# Patient Record
Sex: Male | Born: 1935
Health system: Southern US, Community
[De-identification: ages and names within clinical notes are randomized; demographics above are authoritative.]

## PROBLEM LIST (undated history)

## (undated) DIAGNOSIS — E7800 Pure hypercholesterolemia, unspecified: Secondary | ICD-10-CM

## (undated) DIAGNOSIS — M79606 Pain in leg, unspecified: Secondary | ICD-10-CM

## (undated) DIAGNOSIS — H3560 Retinal hemorrhage, unspecified eye: Secondary | ICD-10-CM

## (undated) HISTORY — PX: KNEE SURGERY: SHX244

## (undated) HISTORY — DX: Pain in leg, unspecified: M79.606

## (undated) HISTORY — PX: HERNIA REPAIR: SHX51

## (undated) HISTORY — PX: EYE SURGERY: SHX253

---

## 2001-02-08 ENCOUNTER — Ambulatory Visit (HOSPITAL_COMMUNITY): Admission: RE | Admit: 2001-02-08 | Discharge: 2001-02-08 | Payer: Self-pay | Admitting: Gastroenterology

## 2001-07-14 ENCOUNTER — Emergency Department (HOSPITAL_COMMUNITY): Admission: EM | Admit: 2001-07-14 | Discharge: 2001-07-14 | Payer: Self-pay | Admitting: Emergency Medicine

## 2001-07-14 ENCOUNTER — Encounter: Payer: Self-pay | Admitting: Emergency Medicine

## 2003-02-07 ENCOUNTER — Encounter: Admission: RE | Admit: 2003-02-07 | Discharge: 2003-02-07 | Payer: Self-pay | Admitting: Internal Medicine

## 2003-02-07 ENCOUNTER — Encounter: Payer: Self-pay | Admitting: Internal Medicine

## 2003-06-12 ENCOUNTER — Ambulatory Visit (HOSPITAL_COMMUNITY): Admission: RE | Admit: 2003-06-12 | Discharge: 2003-06-12 | Payer: Self-pay | Admitting: Ophthalmology

## 2004-07-17 ENCOUNTER — Observation Stay (HOSPITAL_COMMUNITY): Admission: EM | Admit: 2004-07-17 | Discharge: 2004-07-18 | Payer: Self-pay | Admitting: Emergency Medicine

## 2009-09-22 DIAGNOSIS — H348192 Central retinal vein occlusion, unspecified eye, stable: Secondary | ICD-10-CM | POA: Insufficient documentation

## 2009-09-22 DIAGNOSIS — R413 Other amnesia: Secondary | ICD-10-CM | POA: Insufficient documentation

## 2009-09-22 DIAGNOSIS — M1990 Unspecified osteoarthritis, unspecified site: Secondary | ICD-10-CM | POA: Insufficient documentation

## 2009-09-22 DIAGNOSIS — J302 Other seasonal allergic rhinitis: Secondary | ICD-10-CM | POA: Insufficient documentation

## 2010-12-17 ENCOUNTER — Other Ambulatory Visit: Payer: Self-pay | Admitting: Dermatology

## 2011-01-02 ENCOUNTER — Emergency Department (HOSPITAL_BASED_OUTPATIENT_CLINIC_OR_DEPARTMENT_OTHER)
Admission: EM | Admit: 2011-01-02 | Discharge: 2011-01-03 | Disposition: A | Discharge status: Home / Self Care | Payer: Medicare Other | Attending: Emergency Medicine | Admitting: Emergency Medicine

## 2011-01-02 DIAGNOSIS — E785 Hyperlipidemia, unspecified: Secondary | ICD-10-CM | POA: Insufficient documentation

## 2011-01-02 DIAGNOSIS — S01309A Unspecified open wound of unspecified ear, initial encounter: Secondary | ICD-10-CM | POA: Insufficient documentation

## 2011-01-02 DIAGNOSIS — W208XXA Other cause of strike by thrown, projected or falling object, initial encounter: Secondary | ICD-10-CM | POA: Insufficient documentation

## 2011-01-03 ENCOUNTER — Emergency Department (INDEPENDENT_AMBULATORY_CARE_PROVIDER_SITE_OTHER): Payer: Medicare Other

## 2011-01-03 DIAGNOSIS — M79609 Pain in unspecified limb: Secondary | ICD-10-CM

## 2011-01-03 DIAGNOSIS — W208XXA Other cause of strike by thrown, projected or falling object, initial encounter: Secondary | ICD-10-CM

## 2011-01-03 DIAGNOSIS — I999 Unspecified disorder of circulatory system: Secondary | ICD-10-CM

## 2011-01-03 DIAGNOSIS — R609 Edema, unspecified: Secondary | ICD-10-CM

## 2011-01-03 DIAGNOSIS — S7010XA Contusion of unspecified thigh, initial encounter: Secondary | ICD-10-CM

## 2011-01-03 DIAGNOSIS — S8010XA Contusion of unspecified lower leg, initial encounter: Secondary | ICD-10-CM

## 2011-01-03 DIAGNOSIS — S9030XA Contusion of unspecified foot, initial encounter: Secondary | ICD-10-CM

## 2011-01-19 ENCOUNTER — Encounter (INDEPENDENT_AMBULATORY_CARE_PROVIDER_SITE_OTHER): Payer: Medicare Other

## 2011-01-19 DIAGNOSIS — R609 Edema, unspecified: Secondary | ICD-10-CM
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
## 2011-03-17 ENCOUNTER — Other Ambulatory Visit: Payer: Self-pay | Admitting: Gastroenterology

## 2011-12-13 DIAGNOSIS — H40019 Open angle with borderline findings, low risk, unspecified eye: Secondary | ICD-10-CM | POA: Diagnosis not present

## 2011-12-13 DIAGNOSIS — H35379 Puckering of macula, unspecified eye: Secondary | ICD-10-CM | POA: Diagnosis not present

## 2011-12-13 DIAGNOSIS — H04129 Dry eye syndrome of unspecified lacrimal gland: Secondary | ICD-10-CM | POA: Diagnosis not present

## 2011-12-13 DIAGNOSIS — H2510 Age-related nuclear cataract, unspecified eye: Secondary | ICD-10-CM | POA: Diagnosis not present

## 2011-12-13 DIAGNOSIS — D3130 Benign neoplasm of unspecified choroid: Secondary | ICD-10-CM | POA: Diagnosis not present

## 2011-12-15 DIAGNOSIS — E785 Hyperlipidemia, unspecified: Secondary | ICD-10-CM | POA: Diagnosis not present

## 2011-12-15 DIAGNOSIS — R609 Edema, unspecified: Secondary | ICD-10-CM | POA: Diagnosis not present

## 2011-12-15 DIAGNOSIS — K219 Gastro-esophageal reflux disease without esophagitis: Secondary | ICD-10-CM | POA: Diagnosis not present

## 2011-12-15 DIAGNOSIS — J301 Allergic rhinitis due to pollen: Secondary | ICD-10-CM | POA: Diagnosis not present

## 2012-01-03 ENCOUNTER — Other Ambulatory Visit: Payer: Self-pay | Admitting: Dermatology

## 2012-01-03 DIAGNOSIS — L570 Actinic keratosis: Secondary | ICD-10-CM | POA: Diagnosis not present

## 2012-01-03 DIAGNOSIS — Z85828 Personal history of other malignant neoplasm of skin: Secondary | ICD-10-CM | POA: Diagnosis not present

## 2012-01-03 DIAGNOSIS — D239 Other benign neoplasm of skin, unspecified: Secondary | ICD-10-CM | POA: Diagnosis not present

## 2012-01-26 DIAGNOSIS — H538 Other visual disturbances: Secondary | ICD-10-CM | POA: Diagnosis not present

## 2012-03-13 DIAGNOSIS — L821 Other seborrheic keratosis: Secondary | ICD-10-CM | POA: Diagnosis not present

## 2012-03-13 DIAGNOSIS — L570 Actinic keratosis: Secondary | ICD-10-CM | POA: Diagnosis not present

## 2012-04-17 DIAGNOSIS — Z23 Encounter for immunization: Secondary | ICD-10-CM | POA: Diagnosis not present

## 2012-06-13 DIAGNOSIS — G459 Transient cerebral ischemic attack, unspecified: Secondary | ICD-10-CM | POA: Diagnosis not present

## 2012-06-13 DIAGNOSIS — H3400 Transient retinal artery occlusion, unspecified eye: Secondary | ICD-10-CM | POA: Diagnosis not present

## 2012-06-13 DIAGNOSIS — H43399 Other vitreous opacities, unspecified eye: Secondary | ICD-10-CM | POA: Diagnosis not present

## 2012-06-13 DIAGNOSIS — H349 Unspecified retinal vascular occlusion: Secondary | ICD-10-CM | POA: Diagnosis not present

## 2012-06-13 DIAGNOSIS — H40019 Open angle with borderline findings, low risk, unspecified eye: Secondary | ICD-10-CM | POA: Diagnosis not present

## 2012-06-13 DIAGNOSIS — H2510 Age-related nuclear cataract, unspecified eye: Secondary | ICD-10-CM | POA: Diagnosis not present

## 2012-06-13 DIAGNOSIS — E785 Hyperlipidemia, unspecified: Secondary | ICD-10-CM | POA: Diagnosis not present

## 2012-06-14 DIAGNOSIS — I658 Occlusion and stenosis of other precerebral arteries: Secondary | ICD-10-CM | POA: Diagnosis not present

## 2012-06-14 DIAGNOSIS — G459 Transient cerebral ischemic attack, unspecified: Secondary | ICD-10-CM | POA: Diagnosis not present

## 2012-06-16 DIAGNOSIS — G459 Transient cerebral ischemic attack, unspecified: Secondary | ICD-10-CM | POA: Diagnosis not present

## 2012-06-16 DIAGNOSIS — Z1331 Encounter for screening for depression: Secondary | ICD-10-CM | POA: Diagnosis not present

## 2012-06-16 DIAGNOSIS — H349 Unspecified retinal vascular occlusion: Secondary | ICD-10-CM | POA: Diagnosis not present

## 2012-07-03 ENCOUNTER — Other Ambulatory Visit: Payer: Self-pay | Admitting: Dermatology

## 2012-07-03 DIAGNOSIS — L821 Other seborrheic keratosis: Secondary | ICD-10-CM | POA: Diagnosis not present

## 2012-07-03 DIAGNOSIS — D485 Neoplasm of uncertain behavior of skin: Secondary | ICD-10-CM | POA: Diagnosis not present

## 2012-07-03 DIAGNOSIS — L570 Actinic keratosis: Secondary | ICD-10-CM | POA: Diagnosis not present

## 2012-07-03 DIAGNOSIS — Z85828 Personal history of other malignant neoplasm of skin: Secondary | ICD-10-CM | POA: Diagnosis not present

## 2012-07-03 DIAGNOSIS — L905 Scar conditions and fibrosis of skin: Secondary | ICD-10-CM | POA: Diagnosis not present

## 2012-07-03 DIAGNOSIS — L819 Disorder of pigmentation, unspecified: Secondary | ICD-10-CM | POA: Diagnosis not present

## 2012-07-03 DIAGNOSIS — L851 Acquired keratosis [keratoderma] palmaris et plantaris: Secondary | ICD-10-CM | POA: Diagnosis not present

## 2012-09-26 DIAGNOSIS — E785 Hyperlipidemia, unspecified: Secondary | ICD-10-CM | POA: Diagnosis not present

## 2012-09-26 DIAGNOSIS — Z79899 Other long term (current) drug therapy: Secondary | ICD-10-CM | POA: Diagnosis not present

## 2012-09-26 DIAGNOSIS — Z125 Encounter for screening for malignant neoplasm of prostate: Secondary | ICD-10-CM | POA: Diagnosis not present

## 2012-10-03 DIAGNOSIS — K219 Gastro-esophageal reflux disease without esophagitis: Secondary | ICD-10-CM | POA: Diagnosis not present

## 2012-10-03 DIAGNOSIS — Z125 Encounter for screening for malignant neoplasm of prostate: Secondary | ICD-10-CM | POA: Diagnosis not present

## 2012-10-03 DIAGNOSIS — Z0000 Encounter for general adult medical examination without abnormal findings: Secondary | ICD-10-CM | POA: Diagnosis not present

## 2012-10-03 DIAGNOSIS — G459 Transient cerebral ischemic attack, unspecified: Secondary | ICD-10-CM | POA: Diagnosis not present

## 2012-10-04 DIAGNOSIS — Z1212 Encounter for screening for malignant neoplasm of rectum: Secondary | ICD-10-CM | POA: Diagnosis not present

## 2012-10-05 ENCOUNTER — Other Ambulatory Visit: Payer: Self-pay | Admitting: Dermatology

## 2012-10-05 DIAGNOSIS — L259 Unspecified contact dermatitis, unspecified cause: Secondary | ICD-10-CM | POA: Diagnosis not present

## 2012-10-05 DIAGNOSIS — L719 Rosacea, unspecified: Secondary | ICD-10-CM | POA: Diagnosis not present

## 2012-10-05 DIAGNOSIS — L723 Sebaceous cyst: Secondary | ICD-10-CM | POA: Diagnosis not present

## 2012-10-05 DIAGNOSIS — L570 Actinic keratosis: Secondary | ICD-10-CM | POA: Diagnosis not present

## 2012-10-05 DIAGNOSIS — Z85828 Personal history of other malignant neoplasm of skin: Secondary | ICD-10-CM | POA: Diagnosis not present

## 2012-10-05 DIAGNOSIS — D485 Neoplasm of uncertain behavior of skin: Secondary | ICD-10-CM | POA: Diagnosis not present

## 2013-01-08 DIAGNOSIS — L570 Actinic keratosis: Secondary | ICD-10-CM | POA: Diagnosis not present

## 2013-01-08 DIAGNOSIS — Z85828 Personal history of other malignant neoplasm of skin: Secondary | ICD-10-CM | POA: Diagnosis not present

## 2013-01-08 DIAGNOSIS — L259 Unspecified contact dermatitis, unspecified cause: Secondary | ICD-10-CM | POA: Diagnosis not present

## 2013-01-08 DIAGNOSIS — L821 Other seborrheic keratosis: Secondary | ICD-10-CM | POA: Diagnosis not present

## 2013-03-05 DIAGNOSIS — H40019 Open angle with borderline findings, low risk, unspecified eye: Secondary | ICD-10-CM | POA: Diagnosis not present

## 2013-03-05 DIAGNOSIS — D3130 Benign neoplasm of unspecified choroid: Secondary | ICD-10-CM | POA: Diagnosis not present

## 2013-03-05 DIAGNOSIS — H35379 Puckering of macula, unspecified eye: Secondary | ICD-10-CM | POA: Diagnosis not present

## 2013-03-05 DIAGNOSIS — H35039 Hypertensive retinopathy, unspecified eye: Secondary | ICD-10-CM | POA: Diagnosis not present

## 2013-03-05 DIAGNOSIS — H04129 Dry eye syndrome of unspecified lacrimal gland: Secondary | ICD-10-CM | POA: Diagnosis not present

## 2013-03-05 DIAGNOSIS — H43399 Other vitreous opacities, unspecified eye: Secondary | ICD-10-CM | POA: Diagnosis not present

## 2013-03-05 DIAGNOSIS — H3400 Transient retinal artery occlusion, unspecified eye: Secondary | ICD-10-CM | POA: Diagnosis not present

## 2013-03-05 DIAGNOSIS — H2510 Age-related nuclear cataract, unspecified eye: Secondary | ICD-10-CM | POA: Diagnosis not present

## 2013-03-06 DIAGNOSIS — E785 Hyperlipidemia, unspecified: Secondary | ICD-10-CM | POA: Diagnosis not present

## 2013-03-06 DIAGNOSIS — H349 Unspecified retinal vascular occlusion: Secondary | ICD-10-CM | POA: Diagnosis not present

## 2013-03-06 DIAGNOSIS — I495 Sick sinus syndrome: Secondary | ICD-10-CM | POA: Diagnosis not present

## 2013-03-06 DIAGNOSIS — G459 Transient cerebral ischemic attack, unspecified: Secondary | ICD-10-CM | POA: Diagnosis not present

## 2013-03-12 DIAGNOSIS — E782 Mixed hyperlipidemia: Secondary | ICD-10-CM | POA: Diagnosis not present

## 2013-03-12 DIAGNOSIS — G458 Other transient cerebral ischemic attacks and related syndromes: Secondary | ICD-10-CM | POA: Diagnosis not present

## 2013-03-12 DIAGNOSIS — I498 Other specified cardiac arrhythmias: Secondary | ICD-10-CM | POA: Diagnosis not present

## 2013-03-22 DIAGNOSIS — I498 Other specified cardiac arrhythmias: Secondary | ICD-10-CM | POA: Diagnosis not present

## 2013-03-22 DIAGNOSIS — G459 Transient cerebral ischemic attack, unspecified: Secondary | ICD-10-CM | POA: Diagnosis not present

## 2013-03-29 DIAGNOSIS — I429 Cardiomyopathy, unspecified: Secondary | ICD-10-CM | POA: Diagnosis not present

## 2013-04-03 DIAGNOSIS — I498 Other specified cardiac arrhythmias: Secondary | ICD-10-CM | POA: Diagnosis not present

## 2013-04-03 DIAGNOSIS — G459 Transient cerebral ischemic attack, unspecified: Secondary | ICD-10-CM | POA: Diagnosis not present

## 2013-04-05 DIAGNOSIS — E785 Hyperlipidemia, unspecified: Secondary | ICD-10-CM | POA: Diagnosis not present

## 2013-04-05 DIAGNOSIS — Z23 Encounter for immunization: Secondary | ICD-10-CM | POA: Diagnosis not present

## 2013-04-05 DIAGNOSIS — I495 Sick sinus syndrome: Secondary | ICD-10-CM | POA: Diagnosis not present

## 2013-04-05 DIAGNOSIS — Z6826 Body mass index (BMI) 26.0-26.9, adult: Secondary | ICD-10-CM | POA: Diagnosis not present

## 2013-04-05 DIAGNOSIS — G459 Transient cerebral ischemic attack, unspecified: Secondary | ICD-10-CM | POA: Diagnosis not present

## 2013-04-11 DIAGNOSIS — E782 Mixed hyperlipidemia: Secondary | ICD-10-CM | POA: Diagnosis not present

## 2013-04-11 DIAGNOSIS — G458 Other transient cerebral ischemic attacks and related syndromes: Secondary | ICD-10-CM | POA: Diagnosis not present

## 2013-04-11 DIAGNOSIS — I498 Other specified cardiac arrhythmias: Secondary | ICD-10-CM | POA: Diagnosis not present

## 2013-04-11 DIAGNOSIS — Z8669 Personal history of other diseases of the nervous system and sense organs: Secondary | ICD-10-CM | POA: Diagnosis not present

## 2013-05-07 DIAGNOSIS — E785 Hyperlipidemia, unspecified: Secondary | ICD-10-CM | POA: Diagnosis not present

## 2013-06-06 DIAGNOSIS — H04129 Dry eye syndrome of unspecified lacrimal gland: Secondary | ICD-10-CM | POA: Diagnosis not present

## 2013-06-06 DIAGNOSIS — H40019 Open angle with borderline findings, low risk, unspecified eye: Secondary | ICD-10-CM | POA: Diagnosis not present

## 2013-06-06 DIAGNOSIS — H10509 Unspecified blepharoconjunctivitis, unspecified eye: Secondary | ICD-10-CM | POA: Diagnosis not present

## 2013-07-09 DIAGNOSIS — L570 Actinic keratosis: Secondary | ICD-10-CM | POA: Diagnosis not present

## 2013-07-09 DIAGNOSIS — L821 Other seborrheic keratosis: Secondary | ICD-10-CM | POA: Diagnosis not present

## 2013-07-09 DIAGNOSIS — L408 Other psoriasis: Secondary | ICD-10-CM | POA: Diagnosis not present

## 2013-07-09 DIAGNOSIS — Z85828 Personal history of other malignant neoplasm of skin: Secondary | ICD-10-CM | POA: Diagnosis not present

## 2013-10-01 DIAGNOSIS — E785 Hyperlipidemia, unspecified: Secondary | ICD-10-CM | POA: Diagnosis not present

## 2013-10-01 DIAGNOSIS — Z125 Encounter for screening for malignant neoplasm of prostate: Secondary | ICD-10-CM | POA: Diagnosis not present

## 2013-10-04 DIAGNOSIS — Z0000 Encounter for general adult medical examination without abnormal findings: Secondary | ICD-10-CM | POA: Diagnosis not present

## 2013-10-04 DIAGNOSIS — G459 Transient cerebral ischemic attack, unspecified: Secondary | ICD-10-CM | POA: Diagnosis not present

## 2013-10-04 DIAGNOSIS — K219 Gastro-esophageal reflux disease without esophagitis: Secondary | ICD-10-CM | POA: Diagnosis not present

## 2013-10-04 DIAGNOSIS — H349 Unspecified retinal vascular occlusion: Secondary | ICD-10-CM | POA: Diagnosis not present

## 2013-10-04 DIAGNOSIS — Z125 Encounter for screening for malignant neoplasm of prostate: Secondary | ICD-10-CM | POA: Diagnosis not present

## 2013-10-04 DIAGNOSIS — E785 Hyperlipidemia, unspecified: Secondary | ICD-10-CM | POA: Diagnosis not present

## 2013-10-04 DIAGNOSIS — M1990 Unspecified osteoarthritis, unspecified site: Secondary | ICD-10-CM | POA: Diagnosis not present

## 2013-10-04 DIAGNOSIS — I495 Sick sinus syndrome: Secondary | ICD-10-CM | POA: Diagnosis not present

## 2013-10-04 DIAGNOSIS — Z1331 Encounter for screening for depression: Secondary | ICD-10-CM | POA: Diagnosis not present

## 2013-10-04 DIAGNOSIS — J301 Allergic rhinitis due to pollen: Secondary | ICD-10-CM | POA: Diagnosis not present

## 2013-10-04 DIAGNOSIS — Z23 Encounter for immunization: Secondary | ICD-10-CM | POA: Diagnosis not present

## 2013-10-09 DIAGNOSIS — Z1212 Encounter for screening for malignant neoplasm of rectum: Secondary | ICD-10-CM | POA: Diagnosis not present

## 2013-10-29 DIAGNOSIS — G458 Other transient cerebral ischemic attacks and related syndromes: Secondary | ICD-10-CM | POA: Diagnosis not present

## 2013-10-29 DIAGNOSIS — I498 Other specified cardiac arrhythmias: Secondary | ICD-10-CM | POA: Diagnosis not present

## 2013-10-29 DIAGNOSIS — Z8669 Personal history of other diseases of the nervous system and sense organs: Secondary | ICD-10-CM | POA: Diagnosis not present

## 2013-12-04 DIAGNOSIS — H2510 Age-related nuclear cataract, unspecified eye: Secondary | ICD-10-CM | POA: Diagnosis not present

## 2013-12-04 DIAGNOSIS — H524 Presbyopia: Secondary | ICD-10-CM | POA: Diagnosis not present

## 2013-12-04 DIAGNOSIS — H40019 Open angle with borderline findings, low risk, unspecified eye: Secondary | ICD-10-CM | POA: Diagnosis not present

## 2013-12-04 DIAGNOSIS — H35039 Hypertensive retinopathy, unspecified eye: Secondary | ICD-10-CM | POA: Diagnosis not present

## 2014-01-07 DIAGNOSIS — L570 Actinic keratosis: Secondary | ICD-10-CM | POA: Diagnosis not present

## 2014-01-07 DIAGNOSIS — L821 Other seborrheic keratosis: Secondary | ICD-10-CM | POA: Diagnosis not present

## 2014-01-07 DIAGNOSIS — D1801 Hemangioma of skin and subcutaneous tissue: Secondary | ICD-10-CM | POA: Diagnosis not present

## 2014-01-07 DIAGNOSIS — D692 Other nonthrombocytopenic purpura: Secondary | ICD-10-CM | POA: Diagnosis not present

## 2014-01-07 DIAGNOSIS — Z85828 Personal history of other malignant neoplasm of skin: Secondary | ICD-10-CM | POA: Diagnosis not present

## 2014-04-15 DIAGNOSIS — Z6829 Body mass index (BMI) 29.0-29.9, adult: Secondary | ICD-10-CM | POA: Diagnosis not present

## 2014-04-15 DIAGNOSIS — Z1331 Encounter for screening for depression: Secondary | ICD-10-CM | POA: Diagnosis not present

## 2014-04-15 DIAGNOSIS — G459 Transient cerebral ischemic attack, unspecified: Secondary | ICD-10-CM | POA: Diagnosis not present

## 2014-04-15 DIAGNOSIS — I495 Sick sinus syndrome: Secondary | ICD-10-CM | POA: Diagnosis not present

## 2014-04-15 DIAGNOSIS — E785 Hyperlipidemia, unspecified: Secondary | ICD-10-CM | POA: Diagnosis not present

## 2014-04-15 DIAGNOSIS — K219 Gastro-esophageal reflux disease without esophagitis: Secondary | ICD-10-CM | POA: Diagnosis not present

## 2014-04-15 DIAGNOSIS — H349 Unspecified retinal vascular occlusion: Secondary | ICD-10-CM | POA: Diagnosis not present

## 2014-04-18 DIAGNOSIS — E785 Hyperlipidemia, unspecified: Secondary | ICD-10-CM | POA: Diagnosis not present

## 2014-05-06 DIAGNOSIS — R001 Bradycardia, unspecified: Secondary | ICD-10-CM | POA: Diagnosis not present

## 2014-05-06 DIAGNOSIS — E782 Mixed hyperlipidemia: Secondary | ICD-10-CM | POA: Diagnosis not present

## 2014-05-21 DIAGNOSIS — Z23 Encounter for immunization: Secondary | ICD-10-CM | POA: Diagnosis not present

## 2014-06-10 DIAGNOSIS — H40013 Open angle with borderline findings, low risk, bilateral: Secondary | ICD-10-CM | POA: Diagnosis not present

## 2014-07-08 ENCOUNTER — Other Ambulatory Visit: Payer: Self-pay | Admitting: Dermatology

## 2014-07-08 DIAGNOSIS — D485 Neoplasm of uncertain behavior of skin: Secondary | ICD-10-CM | POA: Diagnosis not present

## 2014-07-08 DIAGNOSIS — L400 Psoriasis vulgaris: Secondary | ICD-10-CM | POA: Diagnosis not present

## 2014-07-08 DIAGNOSIS — L821 Other seborrheic keratosis: Secondary | ICD-10-CM | POA: Diagnosis not present

## 2014-07-08 DIAGNOSIS — L720 Epidermal cyst: Secondary | ICD-10-CM | POA: Diagnosis not present

## 2014-07-08 DIAGNOSIS — L570 Actinic keratosis: Secondary | ICD-10-CM | POA: Diagnosis not present

## 2014-07-08 DIAGNOSIS — Z85828 Personal history of other malignant neoplasm of skin: Secondary | ICD-10-CM | POA: Diagnosis not present

## 2014-07-08 DIAGNOSIS — L853 Xerosis cutis: Secondary | ICD-10-CM | POA: Diagnosis not present

## 2014-10-23 DIAGNOSIS — E785 Hyperlipidemia, unspecified: Secondary | ICD-10-CM | POA: Diagnosis not present

## 2014-10-23 DIAGNOSIS — Z008 Encounter for other general examination: Secondary | ICD-10-CM | POA: Diagnosis not present

## 2014-10-23 DIAGNOSIS — Z125 Encounter for screening for malignant neoplasm of prostate: Secondary | ICD-10-CM | POA: Diagnosis not present

## 2014-10-30 DIAGNOSIS — Z1389 Encounter for screening for other disorder: Secondary | ICD-10-CM | POA: Diagnosis not present

## 2014-10-30 DIAGNOSIS — R001 Bradycardia, unspecified: Secondary | ICD-10-CM | POA: Diagnosis not present

## 2014-10-30 DIAGNOSIS — E785 Hyperlipidemia, unspecified: Secondary | ICD-10-CM | POA: Diagnosis not present

## 2014-10-30 DIAGNOSIS — Z6827 Body mass index (BMI) 27.0-27.9, adult: Secondary | ICD-10-CM | POA: Diagnosis not present

## 2014-10-30 DIAGNOSIS — N400 Enlarged prostate without lower urinary tract symptoms: Secondary | ICD-10-CM | POA: Insufficient documentation

## 2014-10-30 DIAGNOSIS — R413 Other amnesia: Secondary | ICD-10-CM | POA: Diagnosis not present

## 2014-10-30 DIAGNOSIS — M1990 Unspecified osteoarthritis, unspecified site: Secondary | ICD-10-CM | POA: Diagnosis not present

## 2014-10-30 DIAGNOSIS — R600 Localized edema: Secondary | ICD-10-CM | POA: Diagnosis not present

## 2014-10-30 DIAGNOSIS — H34819 Central retinal vein occlusion, unspecified eye: Secondary | ICD-10-CM | POA: Diagnosis not present

## 2014-10-30 DIAGNOSIS — K219 Gastro-esophageal reflux disease without esophagitis: Secondary | ICD-10-CM | POA: Diagnosis not present

## 2014-10-30 DIAGNOSIS — G459 Transient cerebral ischemic attack, unspecified: Secondary | ICD-10-CM | POA: Diagnosis not present

## 2014-10-30 DIAGNOSIS — Z0000 Encounter for general adult medical examination without abnormal findings: Secondary | ICD-10-CM | POA: Diagnosis not present

## 2014-10-30 DIAGNOSIS — N183 Chronic kidney disease, stage 3 (moderate): Secondary | ICD-10-CM | POA: Diagnosis not present

## 2014-10-31 DIAGNOSIS — Z1212 Encounter for screening for malignant neoplasm of rectum: Secondary | ICD-10-CM | POA: Diagnosis not present

## 2014-12-09 DIAGNOSIS — H40013 Open angle with borderline findings, low risk, bilateral: Secondary | ICD-10-CM | POA: Diagnosis not present

## 2014-12-09 DIAGNOSIS — H31001 Unspecified chorioretinal scars, right eye: Secondary | ICD-10-CM | POA: Diagnosis not present

## 2014-12-09 DIAGNOSIS — H04123 Dry eye syndrome of bilateral lacrimal glands: Secondary | ICD-10-CM | POA: Diagnosis not present

## 2014-12-09 DIAGNOSIS — H3582 Retinal ischemia: Secondary | ICD-10-CM | POA: Diagnosis not present

## 2015-01-06 DIAGNOSIS — L400 Psoriasis vulgaris: Secondary | ICD-10-CM | POA: Diagnosis not present

## 2015-01-06 DIAGNOSIS — L570 Actinic keratosis: Secondary | ICD-10-CM | POA: Diagnosis not present

## 2015-01-06 DIAGNOSIS — Z85828 Personal history of other malignant neoplasm of skin: Secondary | ICD-10-CM | POA: Diagnosis not present

## 2015-01-06 DIAGNOSIS — D692 Other nonthrombocytopenic purpura: Secondary | ICD-10-CM | POA: Diagnosis not present

## 2015-01-06 DIAGNOSIS — L820 Inflamed seborrheic keratosis: Secondary | ICD-10-CM | POA: Diagnosis not present

## 2015-01-06 DIAGNOSIS — L821 Other seborrheic keratosis: Secondary | ICD-10-CM | POA: Diagnosis not present

## 2015-02-12 ENCOUNTER — Encounter (HOSPITAL_BASED_OUTPATIENT_CLINIC_OR_DEPARTMENT_OTHER): Payer: Self-pay | Admitting: *Deleted

## 2015-02-12 ENCOUNTER — Emergency Department (HOSPITAL_BASED_OUTPATIENT_CLINIC_OR_DEPARTMENT_OTHER): Payer: Medicare Other

## 2015-02-12 ENCOUNTER — Emergency Department (HOSPITAL_BASED_OUTPATIENT_CLINIC_OR_DEPARTMENT_OTHER)
Admission: EM | Admit: 2015-02-12 | Discharge: 2015-02-12 | Disposition: A | Discharge status: Home / Self Care | Payer: Medicare Other | Attending: Emergency Medicine | Admitting: Emergency Medicine

## 2015-02-12 DIAGNOSIS — Y92410 Unspecified street and highway as the place of occurrence of the external cause: Secondary | ICD-10-CM | POA: Diagnosis not present

## 2015-02-12 DIAGNOSIS — Z7982 Long term (current) use of aspirin: Secondary | ICD-10-CM | POA: Diagnosis not present

## 2015-02-12 DIAGNOSIS — Y9389 Activity, other specified: Secondary | ICD-10-CM | POA: Diagnosis not present

## 2015-02-12 DIAGNOSIS — Z79899 Other long term (current) drug therapy: Secondary | ICD-10-CM | POA: Insufficient documentation

## 2015-02-12 DIAGNOSIS — Z8669 Personal history of other diseases of the nervous system and sense organs: Secondary | ICD-10-CM | POA: Insufficient documentation

## 2015-02-12 DIAGNOSIS — M25551 Pain in right hip: Secondary | ICD-10-CM | POA: Diagnosis not present

## 2015-02-12 DIAGNOSIS — Y998 Other external cause status: Secondary | ICD-10-CM | POA: Diagnosis not present

## 2015-02-12 DIAGNOSIS — S79911A Unspecified injury of right hip, initial encounter: Secondary | ICD-10-CM | POA: Insufficient documentation

## 2015-02-12 DIAGNOSIS — S20221A Contusion of right back wall of thorax, initial encounter: Secondary | ICD-10-CM | POA: Diagnosis not present

## 2015-02-12 DIAGNOSIS — Z7901 Long term (current) use of anticoagulants: Secondary | ICD-10-CM | POA: Insufficient documentation

## 2015-02-12 DIAGNOSIS — S299XXA Unspecified injury of thorax, initial encounter: Secondary | ICD-10-CM | POA: Diagnosis not present

## 2015-02-12 DIAGNOSIS — M1611 Unilateral primary osteoarthritis, right hip: Secondary | ICD-10-CM | POA: Diagnosis not present

## 2015-02-12 HISTORY — DX: Retinal hemorrhage, unspecified eye: H35.60

## 2015-02-12 MED ORDER — HYDROCODONE-ACETAMINOPHEN 5-325 MG PO TABS: 1.0000 | ORAL_TABLET | Freq: Four times a day (QID) | ORAL | Status: DC | PRN

## 2015-02-12 MED ORDER — HYDROCODONE-ACETAMINOPHEN 5-325 MG PO TABS
2.0000 | ORAL_TABLET | Freq: Once | ORAL | Status: AC
  Administered 2015-02-12: 2 via ORAL
  Filled 2015-02-12: qty 2

## 2015-02-12 NOTE — Discharge Instructions (Signed)
Take motrin for pain.  Take vicodin for severe pain. Do NOT drive with it.   See orthopedic doctor for follow up.   Return to ER if you have severe back or hip pain, unable to walk.

## 2015-02-12 NOTE — ED Notes (Signed)
MD at bedside. 
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
## 2015-02-12 NOTE — ED Notes (Signed)
Pt. returned from XR. 

## 2015-02-12 NOTE — ED Notes (Signed)
Pt transported to XR by stretcher.
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
## 2015-02-14 DIAGNOSIS — Z6827 Body mass index (BMI) 27.0-27.9, adult: Secondary | ICD-10-CM | POA: Diagnosis not present

## 2015-02-14 DIAGNOSIS — G589 Mononeuropathy, unspecified: Secondary | ICD-10-CM | POA: Diagnosis not present

## 2015-02-14 DIAGNOSIS — M1611 Unilateral primary osteoarthritis, right hip: Secondary | ICD-10-CM | POA: Diagnosis not present

## 2015-02-14 DIAGNOSIS — M62830 Muscle spasm of back: Secondary | ICD-10-CM | POA: Diagnosis not present

## 2015-02-19 DIAGNOSIS — M545 Low back pain: Secondary | ICD-10-CM | POA: Diagnosis not present

## 2015-04-24 DIAGNOSIS — Z23 Encounter for immunization: Secondary | ICD-10-CM | POA: Diagnosis not present

## 2015-05-05 DIAGNOSIS — E785 Hyperlipidemia, unspecified: Secondary | ICD-10-CM | POA: Diagnosis not present

## 2015-05-05 DIAGNOSIS — G459 Transient cerebral ischemic attack, unspecified: Secondary | ICD-10-CM | POA: Diagnosis not present

## 2015-05-05 DIAGNOSIS — Z6827 Body mass index (BMI) 27.0-27.9, adult: Secondary | ICD-10-CM | POA: Diagnosis not present

## 2015-05-05 DIAGNOSIS — N183 Chronic kidney disease, stage 3 (moderate): Secondary | ICD-10-CM | POA: Diagnosis not present

## 2015-05-05 DIAGNOSIS — K219 Gastro-esophageal reflux disease without esophagitis: Secondary | ICD-10-CM | POA: Diagnosis not present

## 2015-05-05 DIAGNOSIS — M1990 Unspecified osteoarthritis, unspecified site: Secondary | ICD-10-CM | POA: Diagnosis not present

## 2015-05-12 DIAGNOSIS — G451 Carotid artery syndrome (hemispheric): Secondary | ICD-10-CM | POA: Diagnosis not present

## 2015-05-12 DIAGNOSIS — E782 Mixed hyperlipidemia: Secondary | ICD-10-CM | POA: Diagnosis not present

## 2015-05-12 DIAGNOSIS — R001 Bradycardia, unspecified: Secondary | ICD-10-CM | POA: Diagnosis not present

## 2015-05-30 DIAGNOSIS — H02813 Retained foreign body in right eye, unspecified eyelid: Secondary | ICD-10-CM | POA: Diagnosis not present

## 2015-06-16 DIAGNOSIS — H40013 Open angle with borderline findings, low risk, bilateral: Secondary | ICD-10-CM | POA: Diagnosis not present

## 2015-06-16 DIAGNOSIS — H524 Presbyopia: Secondary | ICD-10-CM | POA: Diagnosis not present

## 2015-06-16 DIAGNOSIS — H01003 Unspecified blepharitis right eye, unspecified eyelid: Secondary | ICD-10-CM | POA: Diagnosis not present

## 2015-06-16 DIAGNOSIS — H04123 Dry eye syndrome of bilateral lacrimal glands: Secondary | ICD-10-CM | POA: Diagnosis not present

## 2015-07-14 DIAGNOSIS — L300 Nummular dermatitis: Secondary | ICD-10-CM | POA: Diagnosis not present

## 2015-07-14 DIAGNOSIS — D1801 Hemangioma of skin and subcutaneous tissue: Secondary | ICD-10-CM | POA: Diagnosis not present

## 2015-07-14 DIAGNOSIS — L570 Actinic keratosis: Secondary | ICD-10-CM | POA: Diagnosis not present

## 2015-07-14 DIAGNOSIS — Z85828 Personal history of other malignant neoplasm of skin: Secondary | ICD-10-CM | POA: Diagnosis not present

## 2015-07-14 DIAGNOSIS — L821 Other seborrheic keratosis: Secondary | ICD-10-CM | POA: Diagnosis not present

## 2015-09-01 DIAGNOSIS — J069 Acute upper respiratory infection, unspecified: Secondary | ICD-10-CM | POA: Diagnosis not present

## 2015-09-01 DIAGNOSIS — Z6827 Body mass index (BMI) 27.0-27.9, adult: Secondary | ICD-10-CM | POA: Diagnosis not present

## 2015-09-01 DIAGNOSIS — R05 Cough: Secondary | ICD-10-CM | POA: Diagnosis not present

## 2015-10-11 ENCOUNTER — Emergency Department (HOSPITAL_BASED_OUTPATIENT_CLINIC_OR_DEPARTMENT_OTHER): Payer: Medicare Other

## 2015-10-11 ENCOUNTER — Encounter (HOSPITAL_BASED_OUTPATIENT_CLINIC_OR_DEPARTMENT_OTHER): Payer: Self-pay | Admitting: *Deleted

## 2015-10-11 ENCOUNTER — Emergency Department (HOSPITAL_BASED_OUTPATIENT_CLINIC_OR_DEPARTMENT_OTHER)
Admission: EM | Admit: 2015-10-11 | Discharge: 2015-10-11 | Disposition: A | Discharge status: Home / Self Care | Payer: Medicare Other | Attending: Emergency Medicine | Admitting: Emergency Medicine

## 2015-10-11 DIAGNOSIS — J209 Acute bronchitis, unspecified: Secondary | ICD-10-CM | POA: Diagnosis not present

## 2015-10-11 DIAGNOSIS — J219 Acute bronchiolitis, unspecified: Secondary | ICD-10-CM | POA: Diagnosis not present

## 2015-10-11 DIAGNOSIS — Z7982 Long term (current) use of aspirin: Secondary | ICD-10-CM | POA: Insufficient documentation

## 2015-10-11 DIAGNOSIS — Z79899 Other long term (current) drug therapy: Secondary | ICD-10-CM | POA: Insufficient documentation

## 2015-10-11 DIAGNOSIS — Z8669 Personal history of other diseases of the nervous system and sense organs: Secondary | ICD-10-CM | POA: Diagnosis not present

## 2015-10-11 DIAGNOSIS — Z7902 Long term (current) use of antithrombotics/antiplatelets: Secondary | ICD-10-CM | POA: Insufficient documentation

## 2015-10-11 DIAGNOSIS — R05 Cough: Secondary | ICD-10-CM | POA: Diagnosis present

## 2015-10-11 MED ORDER — ALBUTEROL SULFATE HFA 108 (90 BASE) MCG/ACT IN AERS: 2.0000 | INHALATION_SPRAY | RESPIRATORY_TRACT | Status: DC | PRN

## 2015-10-11 MED ORDER — AEROCHAMBER PLUS W/MASK MISC: Status: DC

## 2015-10-11 NOTE — ED Notes (Signed)
MD at bedside. 

## 2015-10-11 NOTE — Discharge Instructions (Signed)
Acute Bronchitis Use your inhaler 2 puffs every 4 hours as needed for cough or shortness of breath. Return if needed more than every 4 hours. A cold mist humidifier may also help with breathing and cough. Call Dr.Avva to arrange to be seen in the office if not feeling better in 10-14 days. Return if you feel worse for any reason. Bronchitis is inflammation of the airways that extend from the windpipe into the lungs (bronchi). The inflammation often causes mucus to develop. This leads to a cough, which is the most common symptom of bronchitis.  In acute bronchitis, the condition usually develops suddenly and goes away over time, usually in a couple weeks. Smoking, allergies, and asthma can make bronchitis worse. Repeated episodes of bronchitis may cause further lung problems.  CAUSES Acute bronchitis is most often caused by the same virus that causes a cold. The virus can spread from person to person (contagious) through coughing, sneezing, and touching contaminated objects. SIGNS AND SYMPTOMS   Cough.   Fever.   Coughing up mucus.   Body aches.   Chest congestion.   Chills.   Shortness of breath.   Sore throat.  DIAGNOSIS  Acute bronchitis is usually diagnosed through a physical exam. Your health care provider will also ask you questions about your medical history. Tests, such as chest X-rays, are sometimes done to rule out other conditions.  TREATMENT  Acute bronchitis usually goes away in a couple weeks. Oftentimes, no medical treatment is necessary. Medicines are sometimes given for relief of fever or cough. Antibiotic medicines are usually not needed but may be prescribed in certain situations. In some cases, an inhaler may be recommended to help reduce shortness of breath and control the cough. A cool mist vaporizer may also be used to help thin bronchial secretions and make it easier to clear the chest.  HOME CARE INSTRUCTIONS  Get plenty of rest.   Drink enough fluids  to keep your urine clear or pale yellow (unless you have a medical condition that requires fluid restriction). Increasing fluids may help thin your respiratory secretions (sputum) and reduce chest congestion, and it will prevent dehydration.   Take medicines only as directed by your health care provider.  If you were prescribed an antibiotic medicine, finish it all even if you start to feel better.  Avoid smoking and secondhand smoke. Exposure to cigarette smoke or irritating chemicals will make bronchitis worse. If you are a smoker, consider using nicotine gum or skin patches to help control withdrawal symptoms. Quitting smoking will help your lungs heal faster.   Reduce the chances of another bout of acute bronchitis by washing your hands frequently, avoiding people with cold symptoms, and trying not to touch your hands to your mouth, nose, or eyes.   Keep all follow-up visits as directed by your health care provider.  SEEK MEDICAL CARE IF: Your symptoms do not improve after 1 week of treatment.  SEEK IMMEDIATE MEDICAL CARE IF:  You develop an increased fever or chills.   You have chest pain.   You have severe shortness of breath.  You have bloody sputum.   You develop dehydration.  You faint or repeatedly feel like you are going to pass out.  You develop repeated vomiting.  You develop a severe headache. MAKE SURE YOU:   Understand these instructions.  Will watch your condition.  Will get help right away if you are not doing well or get worse.   This information is not intended to  replace advice given to you by your health care provider. Make sure you discuss any questions you have with your health care provider.   Document Released: 08/26/2004 Document Revised: 08/09/2014 Document Reviewed: 01/09/2013 Elsevier Interactive Patient Education [employer].

## 2015-10-11 NOTE — ED Notes (Signed)
URI x 4 days.

## 2015-10-11 NOTE — ED Provider Notes (Signed)
CSN: [id number]     Arrival date & time 10/11/15  1314 History   First MD Initiated Contact with Patient 10/11/15 1351     Chief Complaint  Patient presents with  . URI     (Consider location/radiation/quality/duration/timing/severity/associated sxs/prior Treatment) HPI Complains of cough productive of clear sputum for the past 4 days. No fever. He's treated himself with an albuterol inhaler with partial relief. No other associated symptoms. Symptoms similar to bronchitis he's had in the past. Denies rhinorrhea. Denies chest pain. Nothing makes symptoms better or Past Medical History  Diagnosis Date  . Retinal hemorrhage   Bronchitis History reviewed. No pertinent past surgical history. History reviewed. No pertinent family history. Social History  Substance Use Topics  . Smoking status: Never Smoker   . Smokeless tobacco: None  . Alcohol Use: No    Review of Systems  Constitutional: Negative.   HENT: Negative.   Respiratory: Positive for cough.   Cardiovascular: Negative.   Gastrointestinal: Negative.   Musculoskeletal: Negative.   Skin: Negative.   Neurological: Negative.   Psychiatric/Behavioral: Negative.   All other systems reviewed and are negative.     Allergies  Review of patient's allergies indicates no known allergies.  Home Medications   Prior to Admission medications   Medication Sig Start Date End Date Taking? Authorizing Provider  aspirin 81 MG tablet Take 81 mg by mouth daily.    Historical Provider, MD  cetirizine (ZYRTEC) 10 MG tablet Take 10 mg by mouth daily.    Historical Provider, MD  clopidogrel (PLAVIX) 75 MG tablet Take 75 mg by mouth daily.    Historical Provider, MD  dutasteride (AVODART) 0.5 MG capsule Take 0.5 mg by mouth daily.    Historical Provider, MD  fenofibrate (TRICOR) 48 MG tablet Take 48 mg by mouth daily.    Historical Provider, MD  glucosamine-chondroitin 500-400 MG tablet Take 1 tablet by mouth 3 (three) times daily.     Historical Provider, MD  lansoprazole (PREVACID) 30 MG capsule Take 30 mg by mouth daily at 12 noon.    Historical Provider, MD  pravastatin (PRAVACHOL) 40 MG tablet Take 40 mg by mouth daily.    Historical Provider, MD  psyllium (REGULOID) 0.52 G capsule Take 0.52 g by mouth daily.    Historical Provider, MD   BP 128/86 mmHg  Pulse 88  Temp(Src) 98.2 F (36.8 C) (Oral)  Resp 20  Ht 6' (1.829 m)  Wt 190 lb (86.183 kg)  BMI 25.76 kg/m2  SpO2 98% Physical Exam  Constitutional: He is oriented to person, place, and time. He appears well-developed and well-nourished.  HENT:  Head: Normocephalic and atraumatic.  Eyes: Conjunctivae are normal. Pupils are equal, round, and reactive to light.  Neck: Neck supple. No tracheal deviation present. No thyromegaly present.  Cardiovascular: Normal rate and regular rhythm.   No murmur heard. Pulmonary/Chest: Effort normal and breath sounds normal.  Abdominal: Soft. Bowel sounds are normal. He exhibits no distension. There is no tenderness.  Musculoskeletal: Normal range of motion. He exhibits no edema or tenderness.  Neurological: He is alert and oriented to person, place, and time. Coordination normal.  Skin: Skin is warm and dry. No rash noted.  Psychiatric: He has a normal mood and affect.  Nursing note and vitals reviewed.   ED Course  Procedures (including critical care time) Labs Review Labs Reviewed - No data to display  Imaging Review Dg Chest 2 View  10/11/2015  CLINICAL DATA:  81 year old male with 5  day history of productive cough EXAM: CHEST  2 VIEW COMPARISON:  Prior chest x-ray 02/12/2015 FINDINGS: The lungs are clear and negative for focal airspace consolidation, pulmonary edema or suspicious pulmonary nodule. No pleural effusion or pneumothorax. Cardiac and mediastinal contours are within normal limits. Trace aortic atherosclerosis. No acute fracture or lytic or blastic osseous lesions. The visualized upper abdominal bowel gas  pattern is unremarkable. Mild multilevel degenerative endplate spurring. IMPRESSION: No active cardiopulmonary disease. Electronically Signed   By: Jacqulynn Cadet M.D.   On: 10/11/2015 14:50   I have personally reviewed and evaluated these images and lab results as part of my medical decision-making.   EKG Interpretation None     Chest x-ray viewed by me. No results found for this or any previous visit. Dg Chest 2 View  10/11/2015  CLINICAL DATA:  81 year old male with 5 day history of productive cough EXAM: CHEST  2 VIEW COMPARISON:  Prior chest x-ray 02/12/2015 FINDINGS: The lungs are clear and negative for focal airspace consolidation, pulmonary edema or suspicious pulmonary nodule. No pleural effusion or pneumothorax. Cardiac and mediastinal contours are within normal limits. Trace aortic atherosclerosis. No acute fracture or lytic or blastic osseous lesions. The visualized upper abdominal bowel gas pattern is unremarkable. Mild multilevel degenerative endplate spurring. IMPRESSION: No active cardiopulmonary disease. Electronically Signed   By: Jacqulynn Cadet M.D.   On: 10/11/2015 14:50    MDM  Patient's concern that he will run out of his albuterol inhaler. I've written a prescription for refill with spacer to use 2 puffs every 4 hours when necessary cough or shortness breath. Of also urged him to get a cold mist humidifier. Follow-up with Dr. Dagmar Hait not better in 10-14 days Final diagnoses:  None  Diagnosis acute bronchitis      Orlie Dakin, MD 10/11/15 1600

## 2015-11-05 DIAGNOSIS — E785 Hyperlipidemia, unspecified: Secondary | ICD-10-CM | POA: Diagnosis not present

## 2015-11-05 DIAGNOSIS — Z125 Encounter for screening for malignant neoplasm of prostate: Secondary | ICD-10-CM | POA: Diagnosis not present

## 2015-11-05 DIAGNOSIS — N183 Chronic kidney disease, stage 3 (moderate): Secondary | ICD-10-CM | POA: Diagnosis not present

## 2015-11-12 DIAGNOSIS — Z0000 Encounter for general adult medical examination without abnormal findings: Secondary | ICD-10-CM | POA: Diagnosis not present

## 2015-11-12 DIAGNOSIS — N183 Chronic kidney disease, stage 3 (moderate): Secondary | ICD-10-CM | POA: Diagnosis not present

## 2015-11-12 DIAGNOSIS — H348192 Central retinal vein occlusion, unspecified eye, stable: Secondary | ICD-10-CM | POA: Diagnosis not present

## 2015-11-12 DIAGNOSIS — E784 Other hyperlipidemia: Secondary | ICD-10-CM | POA: Diagnosis not present

## 2015-11-12 DIAGNOSIS — K219 Gastro-esophageal reflux disease without esophagitis: Secondary | ICD-10-CM | POA: Diagnosis not present

## 2015-11-12 DIAGNOSIS — M1990 Unspecified osteoarthritis, unspecified site: Secondary | ICD-10-CM | POA: Diagnosis not present

## 2015-11-12 DIAGNOSIS — Z6827 Body mass index (BMI) 27.0-27.9, adult: Secondary | ICD-10-CM | POA: Diagnosis not present

## 2015-11-12 DIAGNOSIS — J302 Other seasonal allergic rhinitis: Secondary | ICD-10-CM | POA: Diagnosis not present

## 2015-11-12 DIAGNOSIS — G459 Transient cerebral ischemic attack, unspecified: Secondary | ICD-10-CM | POA: Diagnosis not present

## 2015-11-12 DIAGNOSIS — Z1389 Encounter for screening for other disorder: Secondary | ICD-10-CM | POA: Diagnosis not present

## 2015-11-12 DIAGNOSIS — N400 Enlarged prostate without lower urinary tract symptoms: Secondary | ICD-10-CM | POA: Diagnosis not present

## 2015-12-15 DIAGNOSIS — H2513 Age-related nuclear cataract, bilateral: Secondary | ICD-10-CM | POA: Diagnosis not present

## 2015-12-15 DIAGNOSIS — H25013 Cortical age-related cataract, bilateral: Secondary | ICD-10-CM | POA: Diagnosis not present

## 2015-12-15 DIAGNOSIS — H40013 Open angle with borderline findings, low risk, bilateral: Secondary | ICD-10-CM | POA: Diagnosis not present

## 2016-01-12 DIAGNOSIS — L814 Other melanin hyperpigmentation: Secondary | ICD-10-CM | POA: Diagnosis not present

## 2016-01-12 DIAGNOSIS — L570 Actinic keratosis: Secondary | ICD-10-CM | POA: Diagnosis not present

## 2016-01-12 DIAGNOSIS — L723 Sebaceous cyst: Secondary | ICD-10-CM | POA: Diagnosis not present

## 2016-01-12 DIAGNOSIS — L821 Other seborrheic keratosis: Secondary | ICD-10-CM | POA: Diagnosis not present

## 2016-01-12 DIAGNOSIS — Z85828 Personal history of other malignant neoplasm of skin: Secondary | ICD-10-CM | POA: Diagnosis not present

## 2016-02-02 ENCOUNTER — Other Ambulatory Visit: Payer: Self-pay | Admitting: Internal Medicine

## 2016-02-02 DIAGNOSIS — N644 Mastodynia: Secondary | ICD-10-CM | POA: Diagnosis not present

## 2016-02-02 DIAGNOSIS — N400 Enlarged prostate without lower urinary tract symptoms: Secondary | ICD-10-CM | POA: Diagnosis not present

## 2016-02-06 ENCOUNTER — Other Ambulatory Visit: Payer: Self-pay | Admitting: Internal Medicine

## 2016-02-06 ENCOUNTER — Ambulatory Visit
Admission: RE | Admit: 2016-02-06 | Discharge: 2016-02-06 | Disposition: A | Discharge status: Home / Self Care | Payer: Medicare Other | Source: Ambulatory Visit | Attending: Internal Medicine | Admitting: Internal Medicine

## 2016-02-06 DIAGNOSIS — N644 Mastodynia: Secondary | ICD-10-CM

## 2016-05-11 DIAGNOSIS — Z23 Encounter for immunization: Secondary | ICD-10-CM | POA: Diagnosis not present

## 2016-05-11 DIAGNOSIS — N400 Enlarged prostate without lower urinary tract symptoms: Secondary | ICD-10-CM | POA: Diagnosis not present

## 2016-05-11 DIAGNOSIS — N644 Mastodynia: Secondary | ICD-10-CM | POA: Diagnosis not present

## 2016-05-11 DIAGNOSIS — R5383 Other fatigue: Secondary | ICD-10-CM | POA: Diagnosis not present

## 2016-05-11 DIAGNOSIS — N183 Chronic kidney disease, stage 3 (moderate): Secondary | ICD-10-CM | POA: Diagnosis not present

## 2016-05-11 DIAGNOSIS — M1990 Unspecified osteoarthritis, unspecified site: Secondary | ICD-10-CM | POA: Diagnosis not present

## 2016-05-11 DIAGNOSIS — E784 Other hyperlipidemia: Secondary | ICD-10-CM | POA: Diagnosis not present

## 2016-05-11 DIAGNOSIS — Z6826 Body mass index (BMI) 26.0-26.9, adult: Secondary | ICD-10-CM | POA: Diagnosis not present

## 2016-06-23 DIAGNOSIS — H524 Presbyopia: Secondary | ICD-10-CM | POA: Diagnosis not present

## 2016-06-23 DIAGNOSIS — H40013 Open angle with borderline findings, low risk, bilateral: Secondary | ICD-10-CM | POA: Diagnosis not present

## 2016-06-23 DIAGNOSIS — H04123 Dry eye syndrome of bilateral lacrimal glands: Secondary | ICD-10-CM | POA: Diagnosis not present

## 2016-07-15 DIAGNOSIS — D485 Neoplasm of uncertain behavior of skin: Secondary | ICD-10-CM | POA: Diagnosis not present

## 2016-07-15 DIAGNOSIS — Z85828 Personal history of other malignant neoplasm of skin: Secondary | ICD-10-CM | POA: Diagnosis not present

## 2016-07-15 DIAGNOSIS — C4441 Basal cell carcinoma of skin of scalp and neck: Secondary | ICD-10-CM | POA: Diagnosis not present

## 2016-07-15 DIAGNOSIS — L720 Epidermal cyst: Secondary | ICD-10-CM | POA: Diagnosis not present

## 2016-07-15 DIAGNOSIS — D1801 Hemangioma of skin and subcutaneous tissue: Secondary | ICD-10-CM | POA: Diagnosis not present

## 2016-07-15 DIAGNOSIS — D692 Other nonthrombocytopenic purpura: Secondary | ICD-10-CM | POA: Diagnosis not present

## 2016-07-15 DIAGNOSIS — L570 Actinic keratosis: Secondary | ICD-10-CM | POA: Diagnosis not present

## 2016-07-15 DIAGNOSIS — L821 Other seborrheic keratosis: Secondary | ICD-10-CM | POA: Diagnosis not present

## 2016-11-16 DIAGNOSIS — N183 Chronic kidney disease, stage 3 (moderate): Secondary | ICD-10-CM | POA: Diagnosis not present

## 2016-11-16 DIAGNOSIS — E784 Other hyperlipidemia: Secondary | ICD-10-CM | POA: Diagnosis not present

## 2016-11-16 DIAGNOSIS — Z125 Encounter for screening for malignant neoplasm of prostate: Secondary | ICD-10-CM | POA: Diagnosis not present

## 2016-11-23 DIAGNOSIS — M1990 Unspecified osteoarthritis, unspecified site: Secondary | ICD-10-CM | POA: Diagnosis not present

## 2016-11-23 DIAGNOSIS — G459 Transient cerebral ischemic attack, unspecified: Secondary | ICD-10-CM | POA: Diagnosis not present

## 2016-11-23 DIAGNOSIS — Z1389 Encounter for screening for other disorder: Secondary | ICD-10-CM | POA: Diagnosis not present

## 2016-11-23 DIAGNOSIS — H348132 Central retinal vein occlusion, bilateral, stable: Secondary | ICD-10-CM | POA: Diagnosis not present

## 2016-11-23 DIAGNOSIS — Z0000 Encounter for general adult medical examination without abnormal findings: Secondary | ICD-10-CM | POA: Diagnosis not present

## 2016-11-23 DIAGNOSIS — Z6826 Body mass index (BMI) 26.0-26.9, adult: Secondary | ICD-10-CM | POA: Diagnosis not present

## 2016-11-23 DIAGNOSIS — R001 Bradycardia, unspecified: Secondary | ICD-10-CM | POA: Diagnosis not present

## 2016-11-23 DIAGNOSIS — J302 Other seasonal allergic rhinitis: Secondary | ICD-10-CM | POA: Diagnosis not present

## 2016-11-23 DIAGNOSIS — E784 Other hyperlipidemia: Secondary | ICD-10-CM | POA: Diagnosis not present

## 2016-11-23 DIAGNOSIS — N183 Chronic kidney disease, stage 3 (moderate): Secondary | ICD-10-CM | POA: Diagnosis not present

## 2016-11-23 DIAGNOSIS — K219 Gastro-esophageal reflux disease without esophagitis: Secondary | ICD-10-CM | POA: Diagnosis not present

## 2016-11-23 DIAGNOSIS — N400 Enlarged prostate without lower urinary tract symptoms: Secondary | ICD-10-CM | POA: Diagnosis not present

## 2016-12-20 DIAGNOSIS — H35033 Hypertensive retinopathy, bilateral: Secondary | ICD-10-CM | POA: Diagnosis not present

## 2016-12-20 DIAGNOSIS — H40013 Open angle with borderline findings, low risk, bilateral: Secondary | ICD-10-CM | POA: Diagnosis not present

## 2016-12-20 DIAGNOSIS — H25013 Cortical age-related cataract, bilateral: Secondary | ICD-10-CM | POA: Diagnosis not present

## 2016-12-20 DIAGNOSIS — H2513 Age-related nuclear cataract, bilateral: Secondary | ICD-10-CM | POA: Diagnosis not present

## 2017-01-21 DIAGNOSIS — L578 Other skin changes due to chronic exposure to nonionizing radiation: Secondary | ICD-10-CM | POA: Diagnosis not present

## 2017-01-21 DIAGNOSIS — L570 Actinic keratosis: Secondary | ICD-10-CM | POA: Diagnosis not present

## 2017-01-21 DIAGNOSIS — Z85828 Personal history of other malignant neoplasm of skin: Secondary | ICD-10-CM | POA: Diagnosis not present

## 2017-01-21 DIAGNOSIS — D1801 Hemangioma of skin and subcutaneous tissue: Secondary | ICD-10-CM | POA: Diagnosis not present

## 2017-01-21 DIAGNOSIS — L821 Other seborrheic keratosis: Secondary | ICD-10-CM | POA: Diagnosis not present

## 2017-01-21 DIAGNOSIS — L853 Xerosis cutis: Secondary | ICD-10-CM | POA: Diagnosis not present

## 2017-01-21 DIAGNOSIS — D692 Other nonthrombocytopenic purpura: Secondary | ICD-10-CM | POA: Diagnosis not present

## 2017-01-21 DIAGNOSIS — B351 Tinea unguium: Secondary | ICD-10-CM | POA: Diagnosis not present

## 2017-04-26 DIAGNOSIS — K1370 Unspecified lesions of oral mucosa: Secondary | ICD-10-CM | POA: Diagnosis not present

## 2017-04-30 DIAGNOSIS — Z23 Encounter for immunization: Secondary | ICD-10-CM | POA: Diagnosis not present

## 2017-05-03 DIAGNOSIS — K1379 Other lesions of oral mucosa: Secondary | ICD-10-CM | POA: Diagnosis not present

## 2017-05-30 DIAGNOSIS — E7849 Other hyperlipidemia: Secondary | ICD-10-CM | POA: Diagnosis not present

## 2017-05-30 DIAGNOSIS — Z6826 Body mass index (BMI) 26.0-26.9, adult: Secondary | ICD-10-CM | POA: Diagnosis not present

## 2017-05-30 DIAGNOSIS — G458 Other transient cerebral ischemic attacks and related syndromes: Secondary | ICD-10-CM | POA: Diagnosis not present

## 2017-05-30 DIAGNOSIS — N183 Chronic kidney disease, stage 3 (moderate): Secondary | ICD-10-CM | POA: Diagnosis not present

## 2017-05-30 DIAGNOSIS — M1990 Unspecified osteoarthritis, unspecified site: Secondary | ICD-10-CM | POA: Diagnosis not present

## 2017-06-27 DIAGNOSIS — H02402 Unspecified ptosis of left eyelid: Secondary | ICD-10-CM | POA: Diagnosis not present

## 2017-06-27 DIAGNOSIS — H40013 Open angle with borderline findings, low risk, bilateral: Secondary | ICD-10-CM | POA: Diagnosis not present

## 2017-06-27 DIAGNOSIS — H01003 Unspecified blepharitis right eye, unspecified eyelid: Secondary | ICD-10-CM | POA: Diagnosis not present

## 2017-06-27 DIAGNOSIS — H04123 Dry eye syndrome of bilateral lacrimal glands: Secondary | ICD-10-CM | POA: Diagnosis not present

## 2017-07-21 DIAGNOSIS — L300 Nummular dermatitis: Secondary | ICD-10-CM | POA: Diagnosis not present

## 2017-07-21 DIAGNOSIS — L812 Freckles: Secondary | ICD-10-CM | POA: Diagnosis not present

## 2017-07-21 DIAGNOSIS — L570 Actinic keratosis: Secondary | ICD-10-CM | POA: Diagnosis not present

## 2017-07-21 DIAGNOSIS — L84 Corns and callosities: Secondary | ICD-10-CM | POA: Diagnosis not present

## 2017-07-21 DIAGNOSIS — Z85828 Personal history of other malignant neoplasm of skin: Secondary | ICD-10-CM | POA: Diagnosis not present

## 2017-07-21 DIAGNOSIS — L821 Other seborrheic keratosis: Secondary | ICD-10-CM | POA: Diagnosis not present

## 2017-08-15 DIAGNOSIS — J069 Acute upper respiratory infection, unspecified: Secondary | ICD-10-CM | POA: Diagnosis not present

## 2017-08-15 DIAGNOSIS — R05 Cough: Secondary | ICD-10-CM | POA: Diagnosis not present

## 2017-08-15 DIAGNOSIS — H1031 Unspecified acute conjunctivitis, right eye: Secondary | ICD-10-CM | POA: Diagnosis not present

## 2017-11-30 DIAGNOSIS — R82998 Other abnormal findings in urine: Secondary | ICD-10-CM | POA: Diagnosis not present

## 2017-11-30 DIAGNOSIS — Z125 Encounter for screening for malignant neoplasm of prostate: Secondary | ICD-10-CM | POA: Diagnosis not present

## 2017-11-30 DIAGNOSIS — E7849 Other hyperlipidemia: Secondary | ICD-10-CM | POA: Diagnosis not present

## 2017-12-07 DIAGNOSIS — K219 Gastro-esophageal reflux disease without esophagitis: Secondary | ICD-10-CM | POA: Diagnosis not present

## 2017-12-07 DIAGNOSIS — J302 Other seasonal allergic rhinitis: Secondary | ICD-10-CM | POA: Diagnosis not present

## 2017-12-07 DIAGNOSIS — N183 Chronic kidney disease, stage 3 (moderate): Secondary | ICD-10-CM | POA: Diagnosis not present

## 2017-12-07 DIAGNOSIS — M1990 Unspecified osteoarthritis, unspecified site: Secondary | ICD-10-CM | POA: Diagnosis not present

## 2017-12-07 DIAGNOSIS — Z0000 Encounter for general adult medical examination without abnormal findings: Secondary | ICD-10-CM | POA: Diagnosis not present

## 2017-12-07 DIAGNOSIS — Z6826 Body mass index (BMI) 26.0-26.9, adult: Secondary | ICD-10-CM | POA: Diagnosis not present

## 2017-12-07 DIAGNOSIS — G459 Transient cerebral ischemic attack, unspecified: Secondary | ICD-10-CM | POA: Diagnosis not present

## 2017-12-07 DIAGNOSIS — N400 Enlarged prostate without lower urinary tract symptoms: Secondary | ICD-10-CM | POA: Diagnosis not present

## 2017-12-07 DIAGNOSIS — Z1389 Encounter for screening for other disorder: Secondary | ICD-10-CM | POA: Diagnosis not present

## 2017-12-07 DIAGNOSIS — H348191 Central retinal vein occlusion, unspecified eye, with retinal neovascularization: Secondary | ICD-10-CM | POA: Diagnosis not present

## 2017-12-07 DIAGNOSIS — E7849 Other hyperlipidemia: Secondary | ICD-10-CM | POA: Diagnosis not present

## 2017-12-21 DIAGNOSIS — M25511 Pain in right shoulder: Secondary | ICD-10-CM | POA: Diagnosis not present

## 2017-12-21 DIAGNOSIS — M7541 Impingement syndrome of right shoulder: Secondary | ICD-10-CM | POA: Diagnosis not present

## 2018-01-03 DIAGNOSIS — Z1212 Encounter for screening for malignant neoplasm of rectum: Secondary | ICD-10-CM | POA: Diagnosis not present

## 2018-01-04 DIAGNOSIS — H2513 Age-related nuclear cataract, bilateral: Secondary | ICD-10-CM | POA: Diagnosis not present

## 2018-01-04 DIAGNOSIS — H35033 Hypertensive retinopathy, bilateral: Secondary | ICD-10-CM | POA: Diagnosis not present

## 2018-01-04 DIAGNOSIS — H40013 Open angle with borderline findings, low risk, bilateral: Secondary | ICD-10-CM | POA: Diagnosis not present

## 2018-01-04 DIAGNOSIS — D3131 Benign neoplasm of right choroid: Secondary | ICD-10-CM | POA: Diagnosis not present

## 2018-01-19 DIAGNOSIS — Z85828 Personal history of other malignant neoplasm of skin: Secondary | ICD-10-CM | POA: Diagnosis not present

## 2018-01-19 DIAGNOSIS — L570 Actinic keratosis: Secondary | ICD-10-CM | POA: Diagnosis not present

## 2018-01-19 DIAGNOSIS — L821 Other seborrheic keratosis: Secondary | ICD-10-CM | POA: Diagnosis not present

## 2018-01-19 DIAGNOSIS — L812 Freckles: Secondary | ICD-10-CM | POA: Diagnosis not present

## 2018-01-19 DIAGNOSIS — L84 Corns and callosities: Secondary | ICD-10-CM | POA: Diagnosis not present

## 2018-01-19 DIAGNOSIS — D692 Other nonthrombocytopenic purpura: Secondary | ICD-10-CM | POA: Diagnosis not present

## 2018-04-08 DIAGNOSIS — Z23 Encounter for immunization: Secondary | ICD-10-CM | POA: Diagnosis not present

## 2018-06-06 DIAGNOSIS — G459 Transient cerebral ischemic attack, unspecified: Secondary | ICD-10-CM | POA: Diagnosis not present

## 2018-06-06 DIAGNOSIS — Z6827 Body mass index (BMI) 27.0-27.9, adult: Secondary | ICD-10-CM | POA: Diagnosis not present

## 2018-06-06 DIAGNOSIS — M25511 Pain in right shoulder: Secondary | ICD-10-CM | POA: Diagnosis not present

## 2018-06-06 DIAGNOSIS — M65341 Trigger finger, right ring finger: Secondary | ICD-10-CM | POA: Diagnosis not present

## 2018-06-06 DIAGNOSIS — N183 Chronic kidney disease, stage 3 (moderate): Secondary | ICD-10-CM | POA: Diagnosis not present

## 2018-06-06 DIAGNOSIS — M1990 Unspecified osteoarthritis, unspecified site: Secondary | ICD-10-CM | POA: Diagnosis not present

## 2018-06-06 DIAGNOSIS — E7849 Other hyperlipidemia: Secondary | ICD-10-CM | POA: Diagnosis not present

## 2018-06-16 DIAGNOSIS — H01003 Unspecified blepharitis right eye, unspecified eyelid: Secondary | ICD-10-CM | POA: Diagnosis not present

## 2018-06-16 DIAGNOSIS — H04123 Dry eye syndrome of bilateral lacrimal glands: Secondary | ICD-10-CM | POA: Diagnosis not present

## 2018-06-16 DIAGNOSIS — H40013 Open angle with borderline findings, low risk, bilateral: Secondary | ICD-10-CM | POA: Diagnosis not present

## 2018-06-16 DIAGNOSIS — H02402 Unspecified ptosis of left eyelid: Secondary | ICD-10-CM | POA: Diagnosis not present

## 2018-07-10 DIAGNOSIS — M65341 Trigger finger, right ring finger: Secondary | ICD-10-CM | POA: Insufficient documentation

## 2018-07-11 DIAGNOSIS — M13842 Other specified arthritis, left hand: Secondary | ICD-10-CM | POA: Diagnosis not present

## 2018-07-11 DIAGNOSIS — M1812 Unilateral primary osteoarthritis of first carpometacarpal joint, left hand: Secondary | ICD-10-CM | POA: Insufficient documentation

## 2018-07-11 DIAGNOSIS — M13841 Other specified arthritis, right hand: Secondary | ICD-10-CM | POA: Diagnosis not present

## 2018-07-11 DIAGNOSIS — M19041 Primary osteoarthritis, right hand: Secondary | ICD-10-CM | POA: Insufficient documentation

## 2018-07-11 DIAGNOSIS — M65341 Trigger finger, right ring finger: Secondary | ICD-10-CM | POA: Diagnosis not present

## 2018-08-14 DIAGNOSIS — D1801 Hemangioma of skin and subcutaneous tissue: Secondary | ICD-10-CM | POA: Diagnosis not present

## 2018-08-14 DIAGNOSIS — D692 Other nonthrombocytopenic purpura: Secondary | ICD-10-CM | POA: Diagnosis not present

## 2018-08-14 DIAGNOSIS — L821 Other seborrheic keratosis: Secondary | ICD-10-CM | POA: Diagnosis not present

## 2018-08-14 DIAGNOSIS — L738 Other specified follicular disorders: Secondary | ICD-10-CM | POA: Diagnosis not present

## 2018-08-14 DIAGNOSIS — L570 Actinic keratosis: Secondary | ICD-10-CM | POA: Diagnosis not present

## 2018-08-14 DIAGNOSIS — Z85828 Personal history of other malignant neoplasm of skin: Secondary | ICD-10-CM | POA: Diagnosis not present

## 2018-08-22 DIAGNOSIS — M13841 Other specified arthritis, right hand: Secondary | ICD-10-CM | POA: Diagnosis not present

## 2018-08-22 DIAGNOSIS — M65341 Trigger finger, right ring finger: Secondary | ICD-10-CM | POA: Diagnosis not present

## 2018-08-22 DIAGNOSIS — M13842 Other specified arthritis, left hand: Secondary | ICD-10-CM | POA: Diagnosis not present

## 2018-12-04 DIAGNOSIS — N183 Chronic kidney disease, stage 3 (moderate): Secondary | ICD-10-CM | POA: Diagnosis not present

## 2018-12-04 DIAGNOSIS — Z125 Encounter for screening for malignant neoplasm of prostate: Secondary | ICD-10-CM | POA: Diagnosis not present

## 2018-12-04 DIAGNOSIS — E7849 Other hyperlipidemia: Secondary | ICD-10-CM | POA: Diagnosis not present

## 2018-12-06 DIAGNOSIS — R82998 Other abnormal findings in urine: Secondary | ICD-10-CM | POA: Diagnosis not present

## 2018-12-11 DIAGNOSIS — J302 Other seasonal allergic rhinitis: Secondary | ICD-10-CM | POA: Diagnosis not present

## 2018-12-11 DIAGNOSIS — G459 Transient cerebral ischemic attack, unspecified: Secondary | ICD-10-CM | POA: Diagnosis not present

## 2018-12-11 DIAGNOSIS — K219 Gastro-esophageal reflux disease without esophagitis: Secondary | ICD-10-CM | POA: Diagnosis not present

## 2018-12-11 DIAGNOSIS — E785 Hyperlipidemia, unspecified: Secondary | ICD-10-CM | POA: Diagnosis not present

## 2018-12-11 DIAGNOSIS — M1990 Unspecified osteoarthritis, unspecified site: Secondary | ICD-10-CM | POA: Diagnosis not present

## 2018-12-11 DIAGNOSIS — Z0000 Encounter for general adult medical examination without abnormal findings: Secondary | ICD-10-CM | POA: Diagnosis not present

## 2018-12-11 DIAGNOSIS — Z1331 Encounter for screening for depression: Secondary | ICD-10-CM | POA: Diagnosis not present

## 2018-12-11 DIAGNOSIS — M65341 Trigger finger, right ring finger: Secondary | ICD-10-CM | POA: Diagnosis not present

## 2018-12-11 DIAGNOSIS — N400 Enlarged prostate without lower urinary tract symptoms: Secondary | ICD-10-CM | POA: Diagnosis not present

## 2018-12-11 DIAGNOSIS — N183 Chronic kidney disease, stage 3 (moderate): Secondary | ICD-10-CM | POA: Diagnosis not present

## 2018-12-11 DIAGNOSIS — H348192 Central retinal vein occlusion, unspecified eye, stable: Secondary | ICD-10-CM | POA: Diagnosis not present

## 2019-01-24 DIAGNOSIS — H35033 Hypertensive retinopathy, bilateral: Secondary | ICD-10-CM | POA: Diagnosis not present

## 2019-01-24 DIAGNOSIS — H2513 Age-related nuclear cataract, bilateral: Secondary | ICD-10-CM | POA: Diagnosis not present

## 2019-01-24 DIAGNOSIS — H40013 Open angle with borderline findings, low risk, bilateral: Secondary | ICD-10-CM | POA: Diagnosis not present

## 2019-01-24 DIAGNOSIS — H348112 Central retinal vein occlusion, right eye, stable: Secondary | ICD-10-CM | POA: Diagnosis not present

## 2019-04-20 DIAGNOSIS — Z23 Encounter for immunization: Secondary | ICD-10-CM | POA: Diagnosis not present

## 2019-06-14 DIAGNOSIS — E785 Hyperlipidemia, unspecified: Secondary | ICD-10-CM | POA: Diagnosis not present

## 2019-06-14 DIAGNOSIS — M1990 Unspecified osteoarthritis, unspecified site: Secondary | ICD-10-CM | POA: Diagnosis not present

## 2019-06-14 DIAGNOSIS — L84 Corns and callosities: Secondary | ICD-10-CM | POA: Diagnosis not present

## 2019-06-14 DIAGNOSIS — G459 Transient cerebral ischemic attack, unspecified: Secondary | ICD-10-CM | POA: Diagnosis not present

## 2019-06-14 DIAGNOSIS — N1831 Chronic kidney disease, stage 3a: Secondary | ICD-10-CM | POA: Diagnosis not present

## 2019-06-14 DIAGNOSIS — R279 Unspecified lack of coordination: Secondary | ICD-10-CM | POA: Insufficient documentation

## 2019-06-14 DIAGNOSIS — Z23 Encounter for immunization: Secondary | ICD-10-CM | POA: Diagnosis not present

## 2019-06-14 DIAGNOSIS — H348192 Central retinal vein occlusion, unspecified eye, stable: Secondary | ICD-10-CM | POA: Diagnosis not present

## 2019-06-14 DIAGNOSIS — R5383 Other fatigue: Secondary | ICD-10-CM | POA: Diagnosis not present

## 2019-06-14 DIAGNOSIS — N400 Enlarged prostate without lower urinary tract symptoms: Secondary | ICD-10-CM | POA: Diagnosis not present

## 2019-06-14 DIAGNOSIS — K219 Gastro-esophageal reflux disease without esophagitis: Secondary | ICD-10-CM | POA: Diagnosis not present

## 2019-07-03 DIAGNOSIS — H04123 Dry eye syndrome of bilateral lacrimal glands: Secondary | ICD-10-CM | POA: Diagnosis not present

## 2019-07-03 DIAGNOSIS — H524 Presbyopia: Secondary | ICD-10-CM | POA: Diagnosis not present

## 2019-07-03 DIAGNOSIS — H40013 Open angle with borderline findings, low risk, bilateral: Secondary | ICD-10-CM | POA: Diagnosis not present

## 2019-07-05 ENCOUNTER — Ambulatory Visit (INDEPENDENT_AMBULATORY_CARE_PROVIDER_SITE_OTHER): Payer: Medicare Other

## 2019-07-05 ENCOUNTER — Ambulatory Visit (INDEPENDENT_AMBULATORY_CARE_PROVIDER_SITE_OTHER): Payer: Medicare Other | Admitting: Podiatry

## 2019-07-05 ENCOUNTER — Other Ambulatory Visit: Payer: Self-pay

## 2019-07-05 DIAGNOSIS — Q828 Other specified congenital malformations of skin: Secondary | ICD-10-CM | POA: Diagnosis not present

## 2019-07-05 DIAGNOSIS — M79672 Pain in left foot: Secondary | ICD-10-CM

## 2019-07-05 DIAGNOSIS — Z7901 Long term (current) use of anticoagulants: Secondary | ICD-10-CM

## 2019-07-06 ENCOUNTER — Other Ambulatory Visit: Payer: Self-pay | Admitting: Podiatry

## 2019-07-06 DIAGNOSIS — Q828 Other specified congenital malformations of skin: Secondary | ICD-10-CM

## 2019-07-10 NOTE — Progress Notes (Signed)
Subjective:   Patient ID: David Stanton, male   DOB: 84 y.o.   MRN: [id number]   HPI 84 year old male presents the office today for concerns of pain on the plantar aspect of the left foot pointing to submetatarsal 5 area.  This is been ongoing issue he previously saw a dermatologist about 9 to 10 months ago was told he had a callus.  He does file it with a pumice stone which does help some.  Denies any redness or drainage or any swelling.  He has no other concerns today.   Review of Systems  All other systems reviewed and are negative.  Past Medical History:  Diagnosis Date  . Retinal hemorrhage     No past surgical history on file.   Current Outpatient Medications:  .  albuterol (PROVENTIL HFA;VENTOLIN HFA) 108 (90 Base) MCG/ACT inhaler, Inhale 2 puffs into the lungs every 2 (two) hours as needed for wheezing or shortness of breath (cough)., Disp: 1 Inhaler, Rfl: 0 .  aspirin 81 MG tablet, Take 81 mg by mouth daily., Disp: , Rfl:  .  cetirizine (ZYRTEC) 10 MG tablet, Take 10 mg by mouth daily., Disp: , Rfl:  .  clopidogrel (PLAVIX) 75 MG tablet, Take 75 mg by mouth daily., Disp: , Rfl:  .  dutasteride (AVODART) 0.5 MG capsule, Take 0.5 mg by mouth daily., Disp: , Rfl:  .  fenofibrate (TRICOR) 48 MG tablet, Take 48 mg by mouth daily., Disp: , Rfl:  .  glucosamine-chondroitin 500-400 MG tablet, Take 1 tablet by mouth 3 (three) times daily., Disp: , Rfl:  .  lansoprazole (PREVACID) 30 MG capsule, Take 30 mg by mouth daily at 12 noon., Disp: , Rfl:  .  pravastatin (PRAVACHOL) 40 MG tablet, Take 40 mg by mouth daily., Disp: , Rfl:  .  psyllium (REGULOID) 0.52 G capsule, Take 0.52 g by mouth daily., Disp: , Rfl:  .  Spacer/Aero-Holding Chambers (AEROCHAMBER PLUS WITH MASK) inhaler, Use as instructed, Disp: 1 each, Rfl: 2  No Known Allergies      Objective:  Physical Exam  General: AAO x3, NAD  Dermatological: Hyperkeratotic tissue left foot submetatarsal 5.  Upon debridement there  is no underlying ulceration, drainage or any signs of infection noted today.  There is no open lesions.  Vascular: Dorsalis Pedis artery and Posterior Tibial artery pedal pulses are 2/4 bilateral with immedate capillary fill time. There is no pain with calf compression, swelling, warmth, erythema.   Neruologic: Grossly intact via light touch bilateral. Protective threshold with Semmes Wienstein monofilament intact to all pedal sites bilateral.   Musculoskeletal: There is prominence the metatarsal heads plantarly with atrophy of the fat pad.  Muscular strength 5/5 in all groups tested bilateral.  Gait: Unassisted, Nonantalgic.       Assessment:  Hyperkeratotic lesion left foot- on plavix     Plan:  -Treatment options discussed including all alternatives, risks, and complications -Etiology of symptoms were discussed -X-rays were obtained and reviewed with the patient.  No evidence of significant bone spur formation.  No evidence of foreign body or fracture. -Debrided the hyperkeratotic lesion without any complications or bleeding.  Recommend moisturizer daily.  I added metatarsal pad to the insert inside of his shoes.  Discussed offloading.  Return in about 3 months (around 10/03/2019), or if symptoms worsen or fail to improve.  Trula Slade DPM

## 2019-08-20 DIAGNOSIS — L821 Other seborrheic keratosis: Secondary | ICD-10-CM | POA: Diagnosis not present

## 2019-08-20 DIAGNOSIS — Z85828 Personal history of other malignant neoplasm of skin: Secondary | ICD-10-CM | POA: Diagnosis not present

## 2019-08-20 DIAGNOSIS — L812 Freckles: Secondary | ICD-10-CM | POA: Diagnosis not present

## 2019-08-20 DIAGNOSIS — L570 Actinic keratosis: Secondary | ICD-10-CM | POA: Diagnosis not present

## 2019-10-04 ENCOUNTER — Other Ambulatory Visit: Payer: Self-pay

## 2019-10-04 ENCOUNTER — Encounter: Payer: Self-pay | Admitting: Podiatry

## 2019-10-04 ENCOUNTER — Ambulatory Visit (INDEPENDENT_AMBULATORY_CARE_PROVIDER_SITE_OTHER): Payer: Medicare Other | Admitting: Podiatry

## 2019-10-04 VITALS — Temp 97.8°F

## 2019-10-04 DIAGNOSIS — B351 Tinea unguium: Secondary | ICD-10-CM | POA: Diagnosis not present

## 2019-10-04 DIAGNOSIS — M79675 Pain in left toe(s): Secondary | ICD-10-CM

## 2019-10-04 DIAGNOSIS — Z7901 Long term (current) use of anticoagulants: Secondary | ICD-10-CM | POA: Diagnosis not present

## 2019-10-04 DIAGNOSIS — Q828 Other specified congenital malformations of skin: Secondary | ICD-10-CM

## 2019-10-04 DIAGNOSIS — M79674 Pain in right toe(s): Secondary | ICD-10-CM | POA: Diagnosis not present

## 2019-10-17 ENCOUNTER — Other Ambulatory Visit: Payer: Self-pay

## 2019-10-17 ENCOUNTER — Other Ambulatory Visit: Payer: Medicare Other | Admitting: Orthotics

## 2019-10-18 NOTE — Progress Notes (Signed)
Subjective: 85 y.o. returns the office today for painful, elongated, thickened toenails which he cannot trim himself as well as for a callus on the left foot.. Denies any redness or drainage around the nails. Denies any acute changes since last appointment and no new complaints today. Denies any systemic complaints such as fevers, chills, nausea, vomiting.   PCP: Prince Solian, MD  He is on plavix  Objective: AAO 3, NAD DP/PT pulses palpable, CRT less than 3 seconds Nails hypertrophic, dystrophic, elongated, brittle, discolored 10. There is tenderness overlying the nails 1-5 bilaterally. There is no surrounding erythema or drainage along the nail sites. Hyperkeratotic lesion left foot submetatarsal 5.  No underlying ulceration or signs of infection. No open lesions or pre-ulcerative lesions are identified. No other areas of tenderness bilateral lower extremities. No overlying edema, erythema, increased warmth. No pain with calf compression, swelling, warmth, erythema.  Assessment: Patient presents with symptomatic onychomycosis, preulcerative callus currently on Plavix  Plan: -Treatment options including alternatives, risks, complications were discussed -Nails sharply debrided 10 without complication/bleeding. -Hyperkeratotic lesion sharply debrided x1 without any complications or bleeding. -Discussed daily foot inspection. If there are any changes, to call the office immediately.  -Follow-up in 3 months or sooner if any problems are to arise. In the meantime, encouraged to call the office with any questions, concerns, changes symptoms.  Celesta Gentile, DPM

## 2019-11-01 ENCOUNTER — Other Ambulatory Visit: Payer: Self-pay

## 2019-11-01 ENCOUNTER — Ambulatory Visit: Payer: Medicare Other | Admitting: Orthotics

## 2019-11-01 DIAGNOSIS — Q828 Other specified congenital malformations of skin: Secondary | ICD-10-CM

## 2019-11-01 NOTE — Progress Notes (Signed)
Patient came in today to pick up custom made foot orthotics.  The goals were accomplished and the patient reported no dissatisfaction with said orthotics.  Patient was advised of breakin period and how to report any issues. 

## 2019-12-14 DIAGNOSIS — Z125 Encounter for screening for malignant neoplasm of prostate: Secondary | ICD-10-CM | POA: Diagnosis not present

## 2019-12-14 DIAGNOSIS — E7849 Other hyperlipidemia: Secondary | ICD-10-CM | POA: Diagnosis not present

## 2019-12-21 DIAGNOSIS — R82998 Other abnormal findings in urine: Secondary | ICD-10-CM | POA: Diagnosis not present

## 2019-12-21 DIAGNOSIS — Z0000 Encounter for general adult medical examination without abnormal findings: Secondary | ICD-10-CM | POA: Diagnosis not present

## 2019-12-21 DIAGNOSIS — N1832 Chronic kidney disease, stage 3b: Secondary | ICD-10-CM | POA: Diagnosis not present

## 2019-12-21 DIAGNOSIS — R279 Unspecified lack of coordination: Secondary | ICD-10-CM | POA: Diagnosis not present

## 2019-12-21 DIAGNOSIS — N400 Enlarged prostate without lower urinary tract symptoms: Secondary | ICD-10-CM | POA: Diagnosis not present

## 2019-12-21 DIAGNOSIS — M1990 Unspecified osteoarthritis, unspecified site: Secondary | ICD-10-CM | POA: Diagnosis not present

## 2019-12-21 DIAGNOSIS — K219 Gastro-esophageal reflux disease without esophagitis: Secondary | ICD-10-CM | POA: Diagnosis not present

## 2019-12-21 DIAGNOSIS — H348192 Central retinal vein occlusion, unspecified eye, stable: Secondary | ICD-10-CM | POA: Diagnosis not present

## 2019-12-21 DIAGNOSIS — R195 Other fecal abnormalities: Secondary | ICD-10-CM | POA: Diagnosis not present

## 2019-12-21 DIAGNOSIS — G459 Transient cerebral ischemic attack, unspecified: Secondary | ICD-10-CM | POA: Diagnosis not present

## 2019-12-21 DIAGNOSIS — J302 Other seasonal allergic rhinitis: Secondary | ICD-10-CM | POA: Diagnosis not present

## 2019-12-21 DIAGNOSIS — E785 Hyperlipidemia, unspecified: Secondary | ICD-10-CM | POA: Diagnosis not present

## 2019-12-21 DIAGNOSIS — Z1212 Encounter for screening for malignant neoplasm of rectum: Secondary | ICD-10-CM | POA: Diagnosis not present

## 2019-12-26 DIAGNOSIS — Z1212 Encounter for screening for malignant neoplasm of rectum: Secondary | ICD-10-CM | POA: Diagnosis not present

## 2020-01-07 ENCOUNTER — Ambulatory Visit (INDEPENDENT_AMBULATORY_CARE_PROVIDER_SITE_OTHER): Payer: Medicare Other | Admitting: Podiatry

## 2020-01-07 ENCOUNTER — Other Ambulatory Visit: Payer: Self-pay

## 2020-01-07 DIAGNOSIS — B351 Tinea unguium: Secondary | ICD-10-CM | POA: Diagnosis not present

## 2020-01-07 DIAGNOSIS — M79675 Pain in left toe(s): Secondary | ICD-10-CM | POA: Diagnosis not present

## 2020-01-07 DIAGNOSIS — M79674 Pain in right toe(s): Secondary | ICD-10-CM

## 2020-01-07 DIAGNOSIS — Z7901 Long term (current) use of anticoagulants: Secondary | ICD-10-CM | POA: Diagnosis not present

## 2020-01-07 DIAGNOSIS — Q828 Other specified congenital malformations of skin: Secondary | ICD-10-CM

## 2020-01-07 NOTE — Progress Notes (Signed)
Subjective: 85 y.o. returns the office today for painful, elongated, thickened toenails which he cannot trim himself as well as for a callus on the left foot.  States the orthotics have been helpful but the callus still comes back causing occasional discomfort but overall improved.  Denies any redness or drainage around the nails. Denies any acute changes since last appointment and no new complaints today. Denies any systemic complaints such as fevers, chills, nausea, vomiting.   PCP: Prince Solian, MD  He is on plavix  Objective: AAO 3, NAD DP/PT pulses palpable, CRT less than 3 seconds Nails hypertrophic, dystrophic, elongated, brittle, discolored 10. There is tenderness overlying the nails 1-5 bilaterally. There is no surrounding erythema or drainage along the nail sites. Hyperkeratotic lesion left foot submetatarsal 5.  No underlying ulceration or signs of infection. No open lesions or pre-ulcerative lesions are identified. No other areas of tenderness bilateral lower extremities. No overlying edema, erythema, increased warmth. No pain with calf compression, swelling, warmth, erythema.  Assessment: Patient presents with symptomatic onychomycosis, preulcerative callus currently on Plavix  Plan: -Treatment options including alternatives, risks, complications were discussed -Nails sharply debrided 10 without complication/bleeding. -Hyperkeratotic lesion sharply debrided x1 without any complications or bleeding. -Discussed daily foot inspection. If there are any changes, to call the office immediately.  -Follow-up in 3 months or sooner if any problems are to arise. In the meantime, encouraged to call the office with any questions, concerns, changes symptoms.  Celesta Gentile, DPM

## 2020-01-09 DIAGNOSIS — K921 Melena: Secondary | ICD-10-CM | POA: Diagnosis not present

## 2020-01-09 DIAGNOSIS — K2100 Gastro-esophageal reflux disease with esophagitis, without bleeding: Secondary | ICD-10-CM | POA: Diagnosis not present

## 2020-01-09 DIAGNOSIS — K5901 Slow transit constipation: Secondary | ICD-10-CM | POA: Diagnosis not present

## 2020-02-01 ENCOUNTER — Encounter (HOSPITAL_BASED_OUTPATIENT_CLINIC_OR_DEPARTMENT_OTHER): Payer: Self-pay

## 2020-02-01 ENCOUNTER — Emergency Department (HOSPITAL_BASED_OUTPATIENT_CLINIC_OR_DEPARTMENT_OTHER): Payer: Medicare Other

## 2020-02-01 ENCOUNTER — Emergency Department (HOSPITAL_BASED_OUTPATIENT_CLINIC_OR_DEPARTMENT_OTHER)
Admission: EM | Admit: 2020-02-01 | Discharge: 2020-02-01 | Disposition: A | Discharge status: Home / Self Care | Payer: Medicare Other | Attending: Emergency Medicine | Admitting: Emergency Medicine

## 2020-02-01 ENCOUNTER — Other Ambulatory Visit: Payer: Self-pay

## 2020-02-01 DIAGNOSIS — R112 Nausea with vomiting, unspecified: Secondary | ICD-10-CM | POA: Insufficient documentation

## 2020-02-01 DIAGNOSIS — R1084 Generalized abdominal pain: Secondary | ICD-10-CM | POA: Diagnosis not present

## 2020-02-01 DIAGNOSIS — K8689 Other specified diseases of pancreas: Secondary | ICD-10-CM | POA: Diagnosis not present

## 2020-02-01 DIAGNOSIS — K922 Gastrointestinal hemorrhage, unspecified: Secondary | ICD-10-CM | POA: Diagnosis not present

## 2020-02-01 DIAGNOSIS — Z7982 Long term (current) use of aspirin: Secondary | ICD-10-CM | POA: Diagnosis not present

## 2020-02-01 DIAGNOSIS — I129 Hypertensive chronic kidney disease with stage 1 through stage 4 chronic kidney disease, or unspecified chronic kidney disease: Secondary | ICD-10-CM | POA: Diagnosis not present

## 2020-02-01 DIAGNOSIS — E860 Dehydration: Secondary | ICD-10-CM | POA: Diagnosis not present

## 2020-02-01 DIAGNOSIS — K429 Umbilical hernia without obstruction or gangrene: Secondary | ICD-10-CM | POA: Diagnosis not present

## 2020-02-01 DIAGNOSIS — Z87891 Personal history of nicotine dependence: Secondary | ICD-10-CM | POA: Insufficient documentation

## 2020-02-01 DIAGNOSIS — N1832 Chronic kidney disease, stage 3b: Secondary | ICD-10-CM | POA: Diagnosis not present

## 2020-02-01 DIAGNOSIS — Z20822 Contact with and (suspected) exposure to covid-19: Secondary | ICD-10-CM | POA: Diagnosis not present

## 2020-02-01 DIAGNOSIS — Z79899 Other long term (current) drug therapy: Secondary | ICD-10-CM | POA: Insufficient documentation

## 2020-02-01 DIAGNOSIS — I700 Atherosclerosis of aorta: Secondary | ICD-10-CM | POA: Diagnosis not present

## 2020-02-01 HISTORY — DX: Pure hypercholesterolemia, unspecified: E78.00

## 2020-02-01 LAB — CBC WITH DIFFERENTIAL/PLATELET
Abs Immature Granulocytes: 0.05 10*3/uL (ref 0.00–0.07)
Basophils Absolute: 0.1 10*3/uL (ref 0.0–0.1)
Basophils Relative: 0 %
Eosinophils Absolute: 0.1 10*3/uL (ref 0.0–0.5)
Eosinophils Relative: 1 %
HCT: 44.6 % (ref 39.0–52.0)
Hemoglobin: 15.1 g/dL (ref 13.0–17.0)
Immature Granulocytes: 0 %
Lymphocytes Relative: 9 %
Lymphs Abs: 1.2 10*3/uL (ref 0.7–4.0)
MCH: 31.1 pg (ref 26.0–34.0)
MCHC: 33.9 g/dL (ref 30.0–36.0)
MCV: 91.8 fL (ref 80.0–100.0)
Monocytes Absolute: 0.9 10*3/uL (ref 0.1–1.0)
Monocytes Relative: 8 %
Neutro Abs: 10.1 10*3/uL — ABNORMAL HIGH (ref 1.7–7.7)
Neutrophils Relative %: 82 %
Platelets: 208 10*3/uL (ref 150–400)
RBC: 4.86 MIL/uL (ref 4.22–5.81)
RDW: 11.9 % (ref 11.5–15.5)
WBC: 12.4 10*3/uL — ABNORMAL HIGH (ref 4.0–10.5)
nRBC: 0 % (ref 0.0–0.2)

## 2020-02-01 LAB — COMPREHENSIVE METABOLIC PANEL
ALT: 16 U/L (ref 0–44)
AST: 22 U/L (ref 15–41)
Albumin: 4.8 g/dL (ref 3.5–5.0)
Alkaline Phosphatase: 42 U/L (ref 38–126)
Anion gap: 13 (ref 5–15)
BUN: 19 mg/dL (ref 8–23)
CO2: 27 mmol/L (ref 22–32)
Calcium: 9.8 mg/dL (ref 8.9–10.3)
Chloride: 100 mmol/L (ref 98–111)
Creatinine, Ser: 1.55 mg/dL — ABNORMAL HIGH (ref 0.61–1.24)
GFR calc Af Amer: 48 mL/min — ABNORMAL LOW (ref >60)
GFR calc non Af Amer: 41 mL/min — ABNORMAL LOW (ref >60)
Glucose, Bld: 124 mg/dL — ABNORMAL HIGH (ref 70–99)
Potassium: 4.4 mmol/L (ref 3.5–5.1)
Sodium: 140 mmol/L (ref 135–145)
Total Bilirubin: 1.1 mg/dL (ref 0.3–1.2)
Total Protein: 8.2 g/dL — ABNORMAL HIGH (ref 6.5–8.1)

## 2020-02-01 LAB — URINALYSIS, ROUTINE W REFLEX MICROSCOPIC
Bilirubin Urine: NEGATIVE
Glucose, UA: NEGATIVE mg/dL
Hgb urine dipstick: NEGATIVE
Ketones, ur: NEGATIVE mg/dL
Leukocytes,Ua: NEGATIVE
Nitrite: NEGATIVE
Protein, ur: NEGATIVE mg/dL
Specific Gravity, Urine: 1.02 (ref 1.005–1.030)
pH: 6 (ref 5.0–8.0)

## 2020-02-01 LAB — LIPASE, BLOOD: Lipase: 27 U/L (ref 11–51)

## 2020-02-01 LAB — SARS CORONAVIRUS 2 BY RT PCR (HOSPITAL ORDER, PERFORMED IN ~~LOC~~ HOSPITAL LAB): SARS Coronavirus 2: NEGATIVE

## 2020-02-01 MED ORDER — MORPHINE SULFATE (PF) 4 MG/ML IV SOLN
4.0000 mg | Freq: Once | INTRAVENOUS | Status: AC
  Administered 2020-02-01: 4 mg via INTRAVENOUS
  Filled 2020-02-01: qty 1

## 2020-02-01 MED ORDER — ONDANSETRON 4 MG PO TBDP: 4.0000 mg | ORAL_TABLET | Freq: Three times a day (TID) | ORAL | 0 refills | Status: AC | PRN

## 2020-02-01 MED ORDER — IOHEXOL 300 MG/ML  SOLN: 75.0000 mL | Freq: Once | INTRAMUSCULAR | Status: DC | PRN

## 2020-02-01 MED ORDER — ONDANSETRON HCL 4 MG/2ML IJ SOLN
4.0000 mg | Freq: Once | INTRAMUSCULAR | Status: AC
  Administered 2020-02-01: 4 mg via INTRAVENOUS
  Filled 2020-02-01: qty 2

## 2020-02-01 MED ORDER — SODIUM CHLORIDE 0.9 % IV BOLUS
1000.0000 mL | Freq: Once | INTRAVENOUS | Status: AC
  Administered 2020-02-01: 1000 mL via INTRAVENOUS

## 2020-02-01 NOTE — ED Provider Notes (Signed)
I assumed care of this patient.  Please see previous provider note for further details of Hx, PE.  Briefly patient is a 85 y.o. male who presented abd discomfort with N/V. Ruling out SBO with CT.   CT negative for SBO. Shows mesenteric adenitis. Feels better. Tolerates PO. Already scheduled for colonoscopy next week. PCP f/u for repeat CT to ensure resolution of adenitis.  The patient appears reasonably screened and/or stabilized for discharge and I doubt any other medical condition or other [facility] requiring further screening, evaluation, or treatment in the ED at this time prior to discharge. Safe for discharge with strict return precautions.  Disposition: Discharge  Condition: Good  I have discussed the results, Dx and Tx plan with the patient/family who expressed understanding and agree(s) with the plan. Discharge instructions discussed at length. The patient/family was given strict return precautions who verbalized understanding of the instructions. No further questions at time of discharge.    ED Discharge Orders         Ordered    ondansetron (ZOFRAN ODT) 4 MG disintegrating tablet  Every 8 hours PRN     Discontinue  Reprint     02/01/20 2331            Follow Up: Prince Solian, MD [place] [id number] [phone]            Fatima Blank, MD 02/01/20 [phone]

## 2020-02-01 NOTE — ED Triage Notes (Signed)
Pt c/o abd pain, n/v,constipation-states he recently had +blood in stool and is awaiting GI workup-NAD-steady gait

## 2020-02-01 NOTE — Discharge Instructions (Addendum)
IMPRESSION:  1. Focal region of mid to upper mesenteric hazy stranding with  numerous reactive appearing clustered mid mesenteric lymph nodes  compatible with mesenteritis.  2. Moderate colonic stool burden with a small rectal stool ball  measuring up to 6.4 cm in diameter without perirectal inflammation.  Correlate for features of constipation/slowed intestinal transit.  3. Few scattered colonic diverticula without evidence of acute  diverticulitis. Colonic diverticula can provide a source of lower GI  bleeding in the absence of inflammation.  4. No other acute abdominopelvic abnormality.  5. Subpleural reticulations in the lung bases could reflect  developing interstitial fibrotic changes. Could be better  characterized with nonemergent outpatient HRCT if clinically  warranted.  6. Aortic Atherosclerosis (ICD10-I70.0).

## 2020-02-01 NOTE — ED Provider Notes (Signed)
[facility] EMERGENCY DEPARTMENT Provider Note   CSN: [id number] Arrival date & time: 02/01/20  2024     History Chief Complaint  Patient presents with  . Abdominal Pain    David Stanton is a 85 y.o. male.  Pt presents to the ED today with abdominal pain and n/v.  Pt has also not had a bm in 3 days.  He had some indigestion last night.  He took 2 pepcid ACs and had to sleep in his recliner.  He did not feel well this am.  Pt started having some vomiting at 1600.  He feels like his abdomen is swelling.  His son had similar sx and came here and was found to have metastatic cancer and died 2 months later.  He is worried he has something similar.  Pt has also had some blood in stool that was found in pcp's office.  He is scheduled for an endoscopy and a colonoscopy on Thursday, June 9 with Dr. Watt Climes.        Past Medical History:  Diagnosis Date  . High cholesterol   . Retinal hemorrhage     There are no problems to display for this patient.   Past Surgical History:  Procedure Laterality Date  . EYE SURGERY    . HERNIA REPAIR    . KNEE SURGERY         No family history on file.  Social History   Tobacco Use  . Smoking status: Former Research scientist (life sciences)  . Smokeless tobacco: Never Used  Vaping Use  . Vaping Use: Never used  Substance Use Topics  . Alcohol use: Yes    Comment: occ  . Drug use: No    Home Medications Prior to Admission medications   Medication Sig Start Date End Date Taking? Authorizing Provider  aspirin 81 MG tablet Take 81 mg by mouth daily.   Yes [provider]  cetirizine (ZYRTEC) 10 MG tablet Take 10 mg by mouth daily.   Yes [provider]  clopidogrel (PLAVIX) 75 MG tablet Take 75 mg by mouth daily.   Yes [provider]  dutasteride (AVODART) 0.5 MG capsule Take 0.5 mg by mouth daily.   Yes [provider]  fenofibrate (TRICOR) 48 MG tablet Take 48 mg by mouth daily.   Yes [provider]   lansoprazole (PREVACID) 30 MG capsule Take 30 mg by mouth daily at 12 noon.   Yes [provider]  pravastatin (PRAVACHOL) 40 MG tablet Take 40 mg by mouth daily.   Yes [provider]  albuterol (PROVENTIL HFA;VENTOLIN HFA) 108 (90 Base) MCG/ACT inhaler Inhale 2 puffs into the lungs every 2 (two) hours as needed for wheezing or shortness of breath (cough). 10/11/15   Orlie Dakin, MD  clobetasol cream (TEMOVATE) 2.54 % Apply 1 application topically 2 (two) times daily. 12/26/19   [provider]  diclofenac Sodium (VOLTAREN) 1 % GEL  06/17/19   [provider]  fluorouracil (EFUDEX) 5 % cream APPLY TO THE AFFECTED AREAS NIGHTLY FOR 14 DAYS 08/20/19   [provider]  glucosamine-chondroitin 500-400 MG tablet Take 1 tablet by mouth 3 (three) times daily.    [provider]  psyllium (REGULOID) 0.52 G capsule Take 0.52 g by mouth daily.    [provider]  Spacer/Aero-Holding Chambers (AEROCHAMBER PLUS WITH MASK) inhaler Use as instructed 10/11/15   Orlie Dakin, MD  trimethoprim-polymyxin b (POLYTRIM) ophthalmic solution polymyxin B sulfate 10,000 unit-trimethoprim 1 mg/mL eye  drops    [provider]    Allergies    Patient has no known allergies.  Review of Systems   Review of Systems  Gastrointestinal: Positive for abdominal distention, abdominal pain, constipation, nausea and vomiting.    Physical Exam Updated Vital Signs BP 132/69   Pulse 71   Temp 97.8 F (36.6 C) (Oral)   Resp 20   Ht 6' (1.829 m)   Wt 86.6 kg   SpO2 91%   BMI 25.90 kg/m   Physical Exam Vitals and nursing note reviewed.  Constitutional:      Appearance: He is well-developed.  HENT:     Head: Normocephalic and atraumatic.     Mouth/Throat:     Mouth: Mucous membranes are moist.     Pharynx: Oropharynx is clear.  Eyes:     Extraocular Movements: Extraocular movements intact.     Pupils: Pupils are equal, round, and reactive to  light.  Cardiovascular:     Rate and Rhythm: Normal rate and regular rhythm.  Pulmonary:     Effort: Pulmonary effort is normal.     Breath sounds: Normal breath sounds.  Abdominal:     General: Abdomen is flat. Bowel sounds are decreased.     Tenderness: There is generalized abdominal tenderness.  Skin:    General: Skin is warm.     Capillary Refill: Capillary refill takes less than 2 seconds.  Neurological:     General: No focal deficit present.     Mental Status: He is alert and oriented to person, place, and time.  Psychiatric:        Mood and Affect: Mood normal.        Behavior: Behavior normal.     ED Results / Procedures / Treatments   Labs (all labs ordered are listed, but only abnormal results are displayed) Labs Reviewed  CBC WITH DIFFERENTIAL/PLATELET - Abnormal; Notable for the following components:      Result Value   WBC 12.4 (*)    Neutro Abs 10.1 (*)    All other components within normal limits  COMPREHENSIVE METABOLIC PANEL - Abnormal; Notable for the following components:   Glucose, Bld 124 (*)    Creatinine, Ser 1.55 (*)    Total Protein 8.2 (*)    GFR calc non Af Amer 41 (*)    GFR calc Af Amer 48 (*)    All other components within normal limits  SARS CORONAVIRUS 2 BY RT PCR (HOSPITAL ORDER, [facility])  LIPASE, BLOOD  URINALYSIS, ROUTINE W REFLEX MICROSCOPIC    EKG None  Radiology No results found.  Procedures Procedures (including critical care time)  Medications Ordered in ED Medications  iohexol (OMNIPAQUE) 300 MG/ML solution 75 mL ( Intravenous Canceled Entry 02/01/20 2235)  morphine 4 MG/ML injection 4 mg (4 mg Intravenous Given 02/01/20 2122)  ondansetron (ZOFRAN) injection 4 mg (4 mg Intravenous Given 02/01/20 2121)  sodium chloride 0.9 % bolus 1,000 mL (0 mLs Intravenous Stopped 02/01/20 2222)    ED Course  I have reviewed the triage vital signs and the nursing notes.  Pertinent labs & imaging results that  were available during my care of the patient were reviewed by me and considered in my medical decision making (see chart for details).    MDM Rules/Calculators/A&P                         Pt is feeling much better  after IVFs and meds.  CT is pending at shift change.  Pt signed out to Dr. Leonette Monarch.  Final Clinical Impression(s) / ED Diagnoses Final diagnoses:  Generalized abdominal pain  Dehydration  Non-intractable vomiting with nausea, unspecified vomiting type  Stage 3b chronic kidney disease    Rx / DC Orders ED Discharge Orders    None       Isla Pence, MD 02/01/20 2303

## 2020-02-06 DIAGNOSIS — Z1159 Encounter for screening for other viral diseases: Secondary | ICD-10-CM | POA: Diagnosis not present

## 2020-02-20 DIAGNOSIS — E7849 Other hyperlipidemia: Secondary | ICD-10-CM | POA: Diagnosis not present

## 2020-04-14 ENCOUNTER — Ambulatory Visit (INDEPENDENT_AMBULATORY_CARE_PROVIDER_SITE_OTHER): Payer: Medicare Other | Admitting: Podiatry

## 2020-04-14 ENCOUNTER — Other Ambulatory Visit: Payer: Self-pay

## 2020-04-14 ENCOUNTER — Encounter: Payer: Self-pay | Admitting: Podiatry

## 2020-04-14 DIAGNOSIS — Q828 Other specified congenital malformations of skin: Secondary | ICD-10-CM | POA: Diagnosis not present

## 2020-04-14 DIAGNOSIS — M79672 Pain in left foot: Secondary | ICD-10-CM

## 2020-04-14 DIAGNOSIS — M79674 Pain in right toe(s): Secondary | ICD-10-CM | POA: Diagnosis not present

## 2020-04-14 DIAGNOSIS — M79675 Pain in left toe(s): Secondary | ICD-10-CM

## 2020-04-14 DIAGNOSIS — B351 Tinea unguium: Secondary | ICD-10-CM | POA: Diagnosis not present

## 2020-04-15 NOTE — Progress Notes (Addendum)
Subjective:  Patient ID: David Stanton, male    DOB: 19-Jan-1935,  MRN: [id number]  85 y.o. male presents with painful thick toenails that are difficult to trim. Pain interferes with ambulation. Aggravating factors include wearing enclosed shoe gear. Pain is relieved with periodic professional debridement. and painful porokeratotic lesions plantar aspect left foot.  Pain prevent comfortable ambulation. Aggravating factor is weightbearing with or without shoegear. Pain relieved with periodic debridement.  He states he wears his custom orthotics and they are offloaded for lesion on plantar aspect of the left foot. However, he does walk on wood and tile floors in the home with only socks on and notices tenderness without shoes/orthotics.  Review of Systems: Negative except as noted in the HPI.  Past Medical History:  Diagnosis Date  . High cholesterol   . Retinal hemorrhage    Past Surgical History:  Procedure Laterality Date  . EYE SURGERY    . HERNIA REPAIR    . KNEE SURGERY     Patient Active Problem List   Diagnosis Date Noted  . Arthritis of carpometacarpal [facility]) joint of left thumb 07/11/2018  . Acquired trigger finger of right ring finger 07/10/2018  . Pain in joint of right shoulder 12/21/2017    Current Outpatient Medications:  .  albuterol (PROVENTIL HFA;VENTOLIN HFA) 108 (90 Base) MCG/ACT inhaler, Inhale 2 puffs into the lungs every 2 (two) hours as needed for wheezing or shortness of breath (cough)., Disp: 1 Inhaler, Rfl: 0 .  aspirin 81 MG tablet, Take 81 mg by mouth daily., Disp: , Rfl:  .  cetirizine (ZYRTEC) 10 MG tablet, Take 10 mg by mouth daily., Disp: , Rfl:  .  clobetasol cream (TEMOVATE) 4.70 %, Apply 1 application topically 2 (two) times daily., Disp: , Rfl:  .  clopidogrel (PLAVIX) 75 MG tablet, Take 75 mg by mouth daily., Disp: , Rfl:  .  diclofenac Sodium (VOLTAREN) 1 % GEL, , Disp: , Rfl:  .  dutasteride (AVODART) 0.5 MG capsule, Take 0.5 mg by mouth daily.,  Disp: , Rfl:  .  fenofibrate (TRICOR) 48 MG tablet, Take 48 mg by mouth daily., Disp: , Rfl:  .  fluorouracil (EFUDEX) 5 % cream, APPLY TO THE AFFECTED AREAS NIGHTLY FOR 14 DAYS, Disp: , Rfl:  .  glucosamine-chondroitin 500-400 MG tablet, Take 1 tablet by mouth 3 (three) times daily., Disp: , Rfl:  .  lansoprazole (PREVACID) 30 MG capsule, Take 30 mg by mouth daily at 12 noon., Disp: , Rfl:  .  polyethylene glycol-electrolytes (NULYTELY) 420 g solution, Take 420 g by mouth as directed., Disp: , Rfl:  .  pravastatin (PRAVACHOL) 40 MG tablet, Take 40 mg by mouth daily., Disp: , Rfl:  .  psyllium (REGULOID) 0.52 G capsule, Take 0.52 g by mouth daily., Disp: , Rfl:  .  Spacer/Aero-Holding Chambers (AEROCHAMBER PLUS WITH MASK) inhaler, Use as instructed, Disp: 1 each, Rfl: 2 .  trimethoprim-polymyxin b (POLYTRIM) ophthalmic solution, polymyxin B sulfate 10,000 unit-trimethoprim 1 mg/mL eye drops, Disp: , Rfl:  No Known Allergies Social History   Occupational History  . Not on file  Tobacco Use  . Smoking status: Former Research scientist (life sciences)  . Smokeless tobacco: Never Used  Vaping Use  . Vaping Use: Never used  Substance and Sexual Activity  . Alcohol use: Yes    Comment: occ  . Drug use: No  . Sexual activity: Not on file    Objective:   Constitutional Pt is a pleasant 85 y.o. Caucasian male  WD, WN in NAD.Marland Kitchen AAO x 3.   Vascular Capillary fill time to digits <3 seconds b/l lower extremities. Palpable pedal pulses b/l LE. Pedal hair sparse. Lower extremity skin temperature gradient within normal limits. No pain with calf compression b/l. No edema noted b/l lower extremities. No cyanosis or clubbing noted.  Neurologic Normal speech. Oriented to person, place, and time. Protective sensation intact 5/5 intact bilaterally with 10g monofilament b/l. Vibratory sensation intact b/l.  Dermatologic Pedal skin with normal turgor, texture and tone bilaterally. No open wounds bilaterally. No interdigital macerations  bilaterally. Toenails 1-5 b/l elongated, discolored, dystrophic, thickened, crumbly with subungual debris and tenderness to dorsal palpation. Porokeratotic lesion(s) submet head 5 left foot. No erythema, no edema, no drainage, no flocculence.  Orthopedic: Normal muscle strength 5/5 to all lower extremity muscle groups bilaterally. No pain crepitus or joint limitation noted with ROM b/l. No gross bony deformities bilaterally. Patient ambulates independent of any assistive aids.   Radiographs: None Assessment:   1. Pain due to onychomycosis of toenails of both feet   2. Porokeratosis   3. Left foot pain    Plan:  Patient was evaluated and treated and all questions answered.  Onychomycosis with pain -Nails palliatively debridement as below. -Educated on self-care  Procedure: Nail Debridement Rationale: Pain Type of Debridement: manual, sharp debridement. Instrumentation: Nail nipper, rotary burr. Number of Nails: 10  -Examined patient. -No new findings. No new orders. -Toenails 1-5 b/l were debrided in length and girth with sterile nail nippers and dremel without iatrogenic bleeding.  -Painful porokeratotic lesion(s) submet head 5 left foot pared and enucleated with sterile scalpel blade without incident. -Advised him to utilize [employer] or [place] when walking on tile/wood flooring. He related understanding. -Patient to report any pedal injuries to medical professional immediately. -Patient to continue soft, supportive shoe gear daily. -Patient/POA to call should there be question/concern in the interim.  Return in about 3 months (around 07/14/2020).  Marzetta Board, DPM

## 2020-06-19 DIAGNOSIS — K219 Gastro-esophageal reflux disease without esophagitis: Secondary | ICD-10-CM | POA: Diagnosis not present

## 2020-06-19 DIAGNOSIS — M1990 Unspecified osteoarthritis, unspecified site: Secondary | ICD-10-CM | POA: Diagnosis not present

## 2020-06-19 DIAGNOSIS — R279 Unspecified lack of coordination: Secondary | ICD-10-CM | POA: Diagnosis not present

## 2020-06-19 DIAGNOSIS — N1832 Chronic kidney disease, stage 3b: Secondary | ICD-10-CM | POA: Diagnosis not present

## 2020-06-19 DIAGNOSIS — R195 Other fecal abnormalities: Secondary | ICD-10-CM | POA: Diagnosis not present

## 2020-06-19 DIAGNOSIS — G459 Transient cerebral ischemic attack, unspecified: Secondary | ICD-10-CM | POA: Diagnosis not present

## 2020-06-19 DIAGNOSIS — N400 Enlarged prostate without lower urinary tract symptoms: Secondary | ICD-10-CM | POA: Diagnosis not present

## 2020-06-19 DIAGNOSIS — H348192 Central retinal vein occlusion, unspecified eye, stable: Secondary | ICD-10-CM | POA: Diagnosis not present

## 2020-07-11 ENCOUNTER — Emergency Department (HOSPITAL_BASED_OUTPATIENT_CLINIC_OR_DEPARTMENT_OTHER)
Admission: EM | Admit: 2020-07-11 | Discharge: 2020-07-11 | Disposition: A | Discharge status: Home / Self Care | Payer: Medicare Other | Attending: Emergency Medicine | Admitting: Emergency Medicine

## 2020-07-11 ENCOUNTER — Other Ambulatory Visit: Payer: Self-pay

## 2020-07-11 ENCOUNTER — Encounter (HOSPITAL_BASED_OUTPATIENT_CLINIC_OR_DEPARTMENT_OTHER): Payer: Self-pay | Admitting: Emergency Medicine

## 2020-07-11 DIAGNOSIS — T162XXA Foreign body in left ear, initial encounter: Secondary | ICD-10-CM | POA: Diagnosis not present

## 2020-07-11 DIAGNOSIS — Y9289 Other specified places as the place of occurrence of the external cause: Secondary | ICD-10-CM | POA: Diagnosis not present

## 2020-07-11 DIAGNOSIS — Z7982 Long term (current) use of aspirin: Secondary | ICD-10-CM | POA: Insufficient documentation

## 2020-07-11 DIAGNOSIS — Y990 Civilian activity done for income or pay: Secondary | ICD-10-CM | POA: Insufficient documentation

## 2020-07-11 DIAGNOSIS — W458XXA Other foreign body or object entering through skin, initial encounter: Secondary | ICD-10-CM | POA: Insufficient documentation

## 2020-07-11 DIAGNOSIS — Z87891 Personal history of nicotine dependence: Secondary | ICD-10-CM | POA: Insufficient documentation

## 2020-07-11 DIAGNOSIS — Z7902 Long term (current) use of antithrombotics/antiplatelets: Secondary | ICD-10-CM | POA: Insufficient documentation

## 2020-07-11 NOTE — ED Triage Notes (Signed)
Patient arrived via POV c/o broken off part of hearing aid in left ear. Patient states decrease in hearing x 2 days. Object found by [place], but unable to remove. Patient is AO x 4, VS WDL, normal gait.

## 2020-07-11 NOTE — ED Provider Notes (Signed)
[facility] EMERGENCY DEPARTMENT Provider Note   CSN: [id number] Arrival date & time: 07/11/20  2039     History Chief Complaint  Patient presents with  . Foreign Body in [place] off hearing aid    David Stanton is a 85 y.o. male who presents to the ED today with complaint of FB in his left ear.  Patient reports he typically wears hearing aids.  Approximately 3 months ago he noticed the tip of his left hearing aid was missing, he did not think much of it and replaced this part.  He states that since then he has noticed that he is not able to hear as well in church however did not think much of it.  He had his physical exam at the [facility] yesterday and mentioned that he was having some difficulty with hearing.  The provider looked in his left ear and noticed a foreign body.  He tried to remove this without success.  His plan was to have patient follow-up with his actual primary care provider to be referred to ENT as he suspected that if the [place] referred to ENT it would take a few weeks.  Patient dates that he sent a message to his PCP yesterday however they cannot see him until next week.  He did not think this was a big deal as he was not having much pain however today he went out and worked in the yard and was blowing leaves while wearing ear protection.  He states after wearing the ear protection he began having pain to his left ear.  This prompted him to come to the ED today.  No other complaints at this time.   The history is provided by the patient, the spouse and medical records.       Past Medical History:  Diagnosis Date  . High cholesterol   . Retinal hemorrhage     Patient Active Problem List   Diagnosis Date Noted  . Arthritis of carpometacarpal [facility]) joint of left thumb 07/11/2018  . Acquired trigger finger of right ring finger 07/10/2018  . Pain in joint of right shoulder 12/21/2017    Past Surgical History:  Procedure Laterality Date  . EYE SURGERY     . HERNIA REPAIR    . KNEE SURGERY         No family history on file.  Social History   Tobacco Use  . Smoking status: Former Research scientist (life sciences)  . Smokeless tobacco: Never Used  Vaping Use  . Vaping Use: Never used  Substance Use Topics  . Alcohol use: Yes    Comment: occ  . Drug use: No    Home Medications Prior to Admission medications   Medication Sig Start Date End Date Taking? Authorizing Provider  albuterol (PROVENTIL HFA;VENTOLIN HFA) 108 (90 Base) MCG/ACT inhaler Inhale 2 puffs into the lungs every 2 (two) hours as needed for wheezing or shortness of breath (cough). 10/11/15   Orlie Dakin, MD  aspirin 81 MG tablet Take 81 mg by mouth daily.    [provider]  cetirizine (ZYRTEC) 10 MG tablet Take 10 mg by mouth daily.    [provider]  clobetasol cream (TEMOVATE) 5.40 % Apply 1 application topically 2 (two) times daily. 12/26/19   [provider]  clopidogrel (PLAVIX) 75 MG tablet Take 75 mg by mouth daily.    [provider]  diclofenac Sodium (VOLTAREN) 1 % GEL  06/17/19   [provider]  dutasteride (AVODART) 0.5 MG capsule Take 0.5 mg by mouth daily.    [provider]  fenofibrate (TRICOR) 48 MG tablet Take 48 mg by mouth daily.    [provider]  fluorouracil (EFUDEX) 5 % cream APPLY TO THE AFFECTED AREAS NIGHTLY FOR 14 DAYS 08/20/19   [provider]  glucosamine-chondroitin 500-400 MG tablet Take 1 tablet by mouth 3 (three) times daily.    [provider]  lansoprazole (PREVACID) 30 MG capsule Take 30 mg by mouth daily at 12 noon.    [provider]  polyethylene glycol-electrolytes (NULYTELY) 420 g solution Take 420 g by mouth as directed. 01/22/20   [provider]  pravastatin (PRAVACHOL) 40 MG tablet Take 40 mg by mouth daily.    [provider]  psyllium (REGULOID) 0.52 G capsule Take 0.52 g by mouth daily.    [provider]  Spacer/Aero-Holding  Chambers (AEROCHAMBER PLUS WITH MASK) inhaler Use as instructed 10/11/15   Orlie Dakin, MD  trimethoprim-polymyxin b (POLYTRIM) ophthalmic solution polymyxin B sulfate 10,000 unit-trimethoprim 1 mg/mL eye drops    [provider]    Allergies    Patient has no known allergies.  Review of Systems   Review of Systems  Constitutional: Negative for chills and fever.  HENT: Positive for ear pain and hearing loss. Negative for ear discharge.     Physical Exam Updated Vital Signs BP (!) 141/57 (BP Location: Left Arm)   Pulse 70   Temp 98.1 F (36.7 C) (Oral)   Resp 17   Ht 6' (1.829 m)   Wt 88 kg   SpO2 99%   BMI 26.31 kg/m   Physical Exam Vitals and nursing note reviewed.  Constitutional:      Appearance: He is not ill-appearing.  HENT:     Head: Normocephalic and atraumatic.     Right Ear: Tympanic membrane normal.     Ears:     Comments: Obvious FB appreciated in Left ear Eyes:     Conjunctiva/sclera: Conjunctivae normal.  Cardiovascular:     Rate and Rhythm: Normal rate and regular rhythm.     Pulses: Normal pulses.  Pulmonary:     Effort: Pulmonary effort is normal.     Breath sounds: Normal breath sounds. No wheezing, rhonchi or rales.  Skin:    General: Skin is warm and dry.     Coloration: Skin is not jaundiced.  Neurological:     Mental Status: He is alert.     ED Results / Procedures / Treatments   Labs (all labs ordered are listed, but only abnormal results are displayed) Labs Reviewed - No data to display  EKG None  Radiology No results found.  Procedures .Foreign Body Removal  Date/Time: 07/11/2020 9:47 PM Performed by: Eustaquio Maize, PA-C Authorized by: Eustaquio Maize, PA-C  Consent: Verbal consent obtained. Consent given by: patient and spouse Body area: ear Location details: left ear Localization method: ENT speculum Removal mechanism: alligator forceps Complexity: simple 1 objects recovered. Objects recovered: tip of  hearing aid Post-procedure assessment: foreign body removed Patient tolerance: patient tolerated the procedure well with no immediate complications   (including critical care time)  Medications Ordered in ED Medications - No data to display  ED Course  I have reviewed the triage vital signs and the nursing notes.  Pertinent labs & imaging results that were available during my care of the patient were reviewed by me and considered in my medical decision making (see chart for  details).    MDM Rules/Calculators/A&P                          85 year old male presents to the ED with foreign body in his left ear, suspect it has been there for approximately 3 months as he noticed a missing tip of his hearing aid 3 months ago with worsening difficulty hearing.  Went to the [place] yesterday with attempt to remove this piece however unsuccessful and was currently waiting on PCP to schedule an outpatient visit with ENT.  Began having some pain today prompting him to come to the ED.  On arrival vitals are stable.  Patient appears to be in no acute distress.  Obvious foreign body appreciated in his left ear, does appear to be deep in the canal.  Will attempt removal at this time. If Unsuccessful we will plan to discuss with ENT.  Attending physician Dr. Roslynn Amble has evaluated patient as well.  Foreign body successfully removed with Dr. Roslynn Amble at bedside.  There was a small amount of bleeding in the canal afterwards however TM is intact.  Will discharge patient home at this time with close ENT follow-up.  Have advised that they call ENT on Monday to schedule an appointment.  Patient and wife are in agreement with plan and patient is stable for discharge.   This note was prepared using Dragon voice recognition software and may include unintentional dictation errors due to the inherent limitations of voice recognition software.   Final Clinical Impression(s) / ED Diagnoses Final diagnoses:  Foreign body of  left ear, initial encounter    Rx / DC Orders ED Discharge Orders    None       Discharge Instructions     Please follow up with [facility]NT regarding your ED visit and the fact that this piece of your hearing aid was in your ear for 3 months  You may experience a small amount of bleeding from your ear however if it continues to bleed excessively please return to the ED for further evaluation   Return to the ED for any worsening symptoms        Eustaquio Maize, PA-C 07/11/20 2156    Lucrezia Starch, MD 07/11/20 2245

## 2020-07-11 NOTE — Discharge Instructions (Addendum)
Please follow up with [facility] ENT regarding your ED visit and the fact that this piece of your hearing aid was in your ear for 3 months  You may experience a small amount of bleeding from your ear however if it continues to bleed excessively please return to the ED for further evaluation   Return to the ED for any worsening symptoms

## 2020-07-14 ENCOUNTER — Ambulatory Visit (INDEPENDENT_AMBULATORY_CARE_PROVIDER_SITE_OTHER): Payer: Medicare Other | Admitting: Podiatry

## 2020-07-14 ENCOUNTER — Encounter: Payer: Self-pay | Admitting: Podiatry

## 2020-07-14 ENCOUNTER — Other Ambulatory Visit: Payer: Self-pay

## 2020-07-14 DIAGNOSIS — M79672 Pain in left foot: Secondary | ICD-10-CM | POA: Diagnosis not present

## 2020-07-14 DIAGNOSIS — K2100 Gastro-esophageal reflux disease with esophagitis, without bleeding: Secondary | ICD-10-CM | POA: Insufficient documentation

## 2020-07-14 DIAGNOSIS — B351 Tinea unguium: Secondary | ICD-10-CM | POA: Diagnosis not present

## 2020-07-14 DIAGNOSIS — M79675 Pain in left toe(s): Secondary | ICD-10-CM

## 2020-07-14 DIAGNOSIS — K5901 Slow transit constipation: Secondary | ICD-10-CM | POA: Insufficient documentation

## 2020-07-14 DIAGNOSIS — M79674 Pain in right toe(s): Secondary | ICD-10-CM

## 2020-07-14 DIAGNOSIS — Q828 Other specified congenital malformations of skin: Secondary | ICD-10-CM

## 2020-07-14 DIAGNOSIS — K921 Melena: Secondary | ICD-10-CM | POA: Insufficient documentation

## 2020-07-16 ENCOUNTER — Ambulatory Visit (INDEPENDENT_AMBULATORY_CARE_PROVIDER_SITE_OTHER): Payer: Medicare Other | Admitting: Otolaryngology

## 2020-07-19 NOTE — Progress Notes (Signed)
Subjective:  Patient ID: David Stanton, male    DOB: 1935/06/24,  MRN: [id number]  85 y.o. male presents with painful porokeratotic lesion left foot and painful, mycotic toenails that are difficult to trim. Pain interferes with ambulation. Aggravating factors include wearing enclosed shoe gear. Pain is relieved with periodic professional debridement. and painful porokeratotic lesions plantar aspect left foot.  Pain prevent comfortable ambulation. Aggravating factor is weightbearing with or without shoegear. Pain relieved with periodic debridement.  He voices no new pedal problems.  Review of Systems: Negative except as noted in the HPI.  Past Medical History:  Diagnosis Date  . High cholesterol   . Retinal hemorrhage    Past Surgical History:  Procedure Laterality Date  . EYE SURGERY    . HERNIA REPAIR    . KNEE SURGERY     Patient Active Problem List   Diagnosis Date Noted  . Hematochezia 07/14/2020  . Chronic reflux esophagitis 07/14/2020  . Slow transit constipation 07/14/2020  . Arthritis of carpometacarpal [facility]) joint of left thumb 07/11/2018  . Acquired trigger finger of right ring finger 07/10/2018  . Pain in joint of right shoulder 12/21/2017    Current Outpatient Medications:  .  albuterol (PROVENTIL HFA;VENTOLIN HFA) 108 (90 Base) MCG/ACT inhaler, Inhale 2 puffs into the lungs every 2 (two) hours as needed for wheezing or shortness of breath (cough)., Disp: 1 Inhaler, Rfl: 0 .  aspirin 81 MG tablet, Take 81 mg by mouth daily., Disp: , Rfl:  .  cetirizine (ZYRTEC) 10 MG tablet, Take 10 mg by mouth daily., Disp: , Rfl:  .  clobetasol cream (TEMOVATE) 6.16 %, Apply 1 application topically 2 (two) times daily., Disp: , Rfl:  .  clopidogrel (PLAVIX) 75 MG tablet, Take 75 mg by mouth daily., Disp: , Rfl:  .  diclofenac Sodium (VOLTAREN) 1 % GEL, , Disp: , Rfl:  .  dutasteride (AVODART) 0.5 MG capsule, Take 0.5 mg by mouth daily., Disp: , Rfl:  .  fenofibrate (TRICOR) 48 MG  tablet, Take 48 mg by mouth daily., Disp: , Rfl:  .  fluorouracil (EFUDEX) 5 % cream, APPLY TO THE AFFECTED AREAS NIGHTLY FOR 14 DAYS, Disp: , Rfl:  .  glucosamine-chondroitin 500-400 MG tablet, Take 1 tablet by mouth 3 (three) times daily., Disp: , Rfl:  .  lansoprazole (PREVACID) 30 MG capsule, Take 30 mg by mouth daily at 12 noon., Disp: , Rfl:  .  polyethylene glycol-electrolytes (NULYTELY) 420 g solution, Take 420 g by mouth as directed., Disp: , Rfl:  .  pravastatin (PRAVACHOL) 40 MG tablet, Take 40 mg by mouth daily., Disp: , Rfl:  .  psyllium (REGULOID) 0.52 G capsule, Take 0.52 g by mouth daily., Disp: , Rfl:  .  Spacer/Aero-Holding Chambers (AEROCHAMBER PLUS WITH MASK) inhaler, Use as instructed, Disp: 1 each, Rfl: 2 .  trimethoprim-polymyxin b (POLYTRIM) ophthalmic solution, polymyxin B sulfate 10,000 unit-trimethoprim 1 mg/mL eye drops, Disp: , Rfl:  No Known Allergies Social History   Occupational History  . Not on file  Tobacco Use  . Smoking status: Former Research scientist (life sciences)  . Smokeless tobacco: Never Used  Vaping Use  . Vaping Use: Never used  Substance and Sexual Activity  . Alcohol use: Yes    Comment: occ  . Drug use: No  . Sexual activity: Not on file    Objective:   Constitutional Pt is a pleasant 85 y.o. Caucasian male WD, WN in NAD.Marland Kitchen AAO x 3.   Vascular Capillary fill time  to digits <3 seconds b/l lower extremities. Palpable pedal pulses b/l LE. Pedal hair sparse. Lower extremity skin temperature gradient within normal limits. No pain with calf compression b/l. No edema noted b/l lower extremities. No cyanosis or clubbing noted.  Neurologic Normal speech. Oriented to person, place, and time. Protective sensation intact 5/5 intact bilaterally with 10g monofilament b/l. Vibratory sensation intact b/l.  Dermatologic Pedal skin with normal turgor, texture and tone bilaterally. No open wounds bilaterally. No interdigital macerations bilaterally. Toenails 1-5 b/l elongated,  discolored, dystrophic, thickened, crumbly with subungual debris and tenderness to dorsal palpation. Porokeratotic lesion(s) submet head 5 left foot. No erythema, no edema, no drainage, no flocculence.  Orthopedic: Normal muscle strength 5/5 to all lower extremity muscle groups bilaterally. No pain crepitus or joint limitation noted with ROM b/l. No gross bony deformities bilaterally. Patient ambulates independent of any assistive aids.   Radiographs: None Assessment:   1. Pain due to onychomycosis of toenails of both feet   2. Porokeratosis   3. Left foot pain    Plan:  Patient was evaluated and treated and all questions answered.  Onychomycosis with pain -Nails palliatively debridement as below. -Educated on self-care  Procedure: Nail Debridement Rationale: Pain Type of Debridement: manual, sharp debridement. Instrumentation: Nail nipper, rotary burr. Number of Nails: 10 -Examined patient. -No new findings. No new orders. -Toenails 1-5 b/l were debrided in length and girth with sterile nail nippers and dremel without iatrogenic bleeding.  -Painful porokeratotic lesion(s) submet head 5 left foot pared and enucleated with sterile scalpel blade without incident. Signed ABN on file. -Patient to report any pedal injuries to medical professional immediately. -Patient to continue soft, supportive shoe gear daily. -Patient/POA to call should there be question/concern in the interim.  Return in about 3 months (around 10/12/2020).  Marzetta Board, DPM

## 2020-08-15 ENCOUNTER — Ambulatory Visit: Payer: Medicare Other | Admitting: Cardiology

## 2020-08-15 ENCOUNTER — Encounter: Payer: Self-pay | Admitting: Cardiology

## 2020-08-15 ENCOUNTER — Other Ambulatory Visit: Payer: Self-pay

## 2020-08-15 VITALS — BP 132/81 | HR 47 | Temp 97.7°F | Resp 16 | Ht 72.0 in | Wt 198.0 lb

## 2020-08-15 DIAGNOSIS — G453 Amaurosis fugax: Secondary | ICD-10-CM

## 2020-08-15 DIAGNOSIS — E7800 Pure hypercholesterolemia, unspecified: Secondary | ICD-10-CM

## 2020-08-15 DIAGNOSIS — R001 Bradycardia, unspecified: Secondary | ICD-10-CM | POA: Diagnosis not present

## 2020-08-15 DIAGNOSIS — N1831 Chronic kidney disease, stage 3a: Secondary | ICD-10-CM

## 2020-08-15 DIAGNOSIS — G459 Transient cerebral ischemic attack, unspecified: Secondary | ICD-10-CM | POA: Diagnosis not present

## 2020-08-15 DIAGNOSIS — I440 Atrioventricular block, first degree: Secondary | ICD-10-CM | POA: Diagnosis not present

## 2020-08-15 NOTE — Progress Notes (Signed)
Primary Physician/Referring:  David Solian, MD  Patient ID: David Stanton, male    DOB: 1935/01/31, 86 y.o.   MRN: [id number]  Chief Complaint  Patient presents with  . Amaurosis Fuga  . TIA   HPI:    David Stanton  is a 86 y.o.  Caucasian male with history of HTN, hyperlipidemia, and recurrent cortical TIA leading to visual disturbances ongoing since 1990s and last episode in 2014 and no recurrence since lipids controlled with the combination of pravastatin and fenofibric acid. He has been on aspirin and Plavix for many years. He also has asymptomatic profound bradycardia for many years with HR 35 to 40/min. He remains asymptomatic despite HR consistently high 30s to low 40s.   He was evaluated by Dr. Monna Fam and after visual examination, she recommended that she follow-up with me.  I would assist with Glennon Mac has been stable and he has not had any recent visual disturbance.  Except for gently decreasing exercise capacity which he states this is related to his age he has no other specific complaints.  He denies marked decrease in exercise capacity, sudden dizziness or syncope.  He has not had any bleeding diathesis except for mild ecchymosis due to aspirin and Plavix.   Past Medical History:  Diagnosis Date  . High cholesterol   . Retinal hemorrhage    Past Surgical History:  Procedure Laterality Date  . EYE SURGERY    . HERNIA REPAIR    . KNEE SURGERY     Family History  Problem Relation Age of Onset  . Stroke Mother   . Cancer Father   . Cancer Son     Social History   Tobacco Use  . Smoking status: Former Research scientist (life sciences)  . Smokeless tobacco: Never Used  Substance Use Topics  . Alcohol use: Yes    Comment: occ   Marital Status: Married   ROS  Review of Systems  Eyes: Positive for blurred vision.  Cardiovascular: Negative for chest pain, dyspnea on exertion and leg swelling.  Gastrointestinal: Negative for melena.   Objective  Blood pressure 132/81,  pulse (!) 47, temperature 97.7 F (36.5 C), resp. rate 16, height 6' (1.829 m), weight 198 lb (89.8 kg), SpO2 97 %.  Vitals with BMI 08/15/2020 07/11/2020 02/01/2020  Height 6\' 0"  6\' 0"  -  Weight 198 lbs 194 lbs -  BMI 08.65 78.46 -  Systolic 962 952 841  Diastolic 81 57 65  Pulse 47 70 79     Physical Exam Cardiovascular:     Rate and Rhythm: Regular rhythm. Bradycardia present.     Pulses: Intact distal pulses.     Heart sounds: Normal heart sounds. No murmur heard. No gallop.      Comments: No leg edema, no JVD. Pulmonary:     Effort: Pulmonary effort is normal.     Breath sounds: Normal breath sounds.  Abdominal:     General: Bowel sounds are normal.     Palpations: Abdomen is soft.    Laboratory examination:   Recent Labs    02/01/20 2107  NA 140  K 4.4  CL 100  CO2 27  GLUCOSE 124*  BUN 19  CREATININE 1.55*  CALCIUM 9.8  GFRNONAA 41*  GFRAA 48*   CrCl cannot be calculated (Patient's most recent lab result is older than the maximum 21 days allowed.).  CMP Latest Ref Rng & Units 02/01/2020  Glucose 70 - 99 mg/dL 124(H)  BUN 8 - 23 mg/dL  19  Creatinine 0.61 - 1.24 mg/dL 1.55(H)  Sodium 135 - 145 mmol/L 140  Potassium 3.5 - 5.1 mmol/L 4.4  Chloride 98 - 111 mmol/L 100  CO2 22 - 32 mmol/L 27  Calcium 8.9 - 10.3 mg/dL 9.8  Total Protein 6.5 - 8.1 g/dL 8.2(H)  Total Bilirubin 0.3 - 1.2 mg/dL 1.1  Alkaline Phos 38 - 126 U/L 42  AST 15 - 41 U/L 22  ALT 0 - 44 U/L 16   CBC Latest Ref Rng & Units 02/01/2020  WBC 4.0 - 10.5 K/uL 12.4(H)  Hemoglobin 13.0 - 17.0 g/dL 15.1  Hematocrit 39.0 - 52.0 % 44.6  Platelets 150 - 400 K/uL 208   External labs:    Cholesterol, total 122.000 m 12/14/2019 HDL 46 MG/DL 12/14/2019 LDL 55.000 mg 12/14/2019 Triglycerides 104.000 12/14/2019  Hemoglobin 15.100 g/d 02/01/2020 Platelets 208.000 K/ 02/01/2020  Creatinine, Serum 1.800 mg/ 02/20/2020 Potassium 4.400 mm 02/01/2020 ALT (SGPT) 16.000 U/L 02/01/2020  TSH 2.230  12/14/2019  Medications and allergies  No Known Allergies   Outpatient Medications Prior to Visit  Medication Sig Dispense Refill  . acetaminophen (TYLENOL) 500 MG tablet Take 500 mg by mouth every 6 (six) hours as needed.    . clobetasol cream (TEMOVATE) 4.16 % Apply 1 application topically 2 (two) times daily.    . clopidogrel (PLAVIX) 75 MG tablet Take 75 mg by mouth daily.    . diclofenac Sodium (VOLTAREN) 1 % GEL     . dutasteride (AVODART) 0.5 MG capsule Take 0.5 mg by mouth daily.    . fenofibrate (TRICOR) 48 MG tablet Take 48 mg by mouth daily.    . fluorouracil (EFUDEX) 5 % cream APPLY TO THE AFFECTED AREAS NIGHTLY FOR 14 DAYS    . pantoprazole (PROTONIX) 40 MG tablet Take 40 mg by mouth daily.    . polyethylene glycol-electrolytes (NULYTELY) 420 g solution Take 420 g by mouth as directed.    . pravastatin (PRAVACHOL) 40 MG tablet Take 40 mg by mouth daily.    . cetirizine (ZYRTEC) 10 MG tablet Take 10 mg by mouth daily.    . pravastatin (PRAVACHOL) 40 MG tablet Take 40 mg by mouth daily.    Marland Kitchen albuterol (PROVENTIL HFA;VENTOLIN HFA) 108 (90 Base) MCG/ACT inhaler Inhale 2 puffs into the lungs every 2 (two) hours as needed for wheezing or shortness of breath (cough). (Patient not taking: Reported on 08/15/2020) 1 Inhaler 0  . aspirin 81 MG tablet Take 81 mg by mouth daily.    Marland Kitchen glucosamine-chondroitin 500-400 MG tablet Take 1 tablet by mouth 3 (three) times daily.    . lansoprazole (PREVACID) 30 MG capsule Take 30 mg by mouth daily at 12 noon. (Patient not taking: Reported on 08/15/2020)    . psyllium (REGULOID) 0.52 G capsule Take 0.52 g by mouth daily.    Marland Kitchen Spacer/Aero-Holding Chambers (AEROCHAMBER PLUS WITH MASK) inhaler Use as instructed 1 each 2  . trimethoprim-polymyxin b (POLYTRIM) ophthalmic solution polymyxin B sulfate 10,000 unit-trimethoprim 1 mg/mL eye drops     No facility-administered medications prior to visit.    Radiology:   CT scan of the abdomen 02/01/2020: 1.  Focal region of mid to upper mesenteric hazy stranding with numerous reactive appearing clustered mid mesenteric lymph nodes compatible with mesenteritis. 2. Moderate colonic stool burden with a small rectal stool ball measuring up to 6.4 cm in diameter without perirectal inflammation. Correlate for features of constipation/slowed intestinal transit. 3. Few scattered colonic diverticula without evidence of acute  diverticulitis. Colonic diverticula can provide a source of lower GI bleeding in the absence of inflammation. 4. No other acute abdominopelvic abnormality. 5. Subpleural reticulations in the lung bases could reflect developing interstitial fibrotic changes. Could be better characterized with nonemergent outpatient HRCT if clinically warranted. 6. Aortic Atherosclerosis   Cardiac Studies:   Stress EKG 04/03/2013: Indications: Bradycardia, Screening for CAD. Conclusions: Normal HR response to exercise. Normal BP. Good exercise tolerence. Continue primary prevention. No indication for pacemaker. Symptoms: THR reached. No symptoms. Arrhythmia: PAC and Occasional non conducted PAC. The patient exercised according to the Bruce protocol, Total time recorded 6 Min. 2 sec. achieving a max heart rate of 172 which was 119% of MPHR for age and 7.3 METS of work. 3 mm ST depression back to baseline immediately into recovery  Carotid duplex 03/29/2013:  No evidence of hemodynamically significant stenosis in the bilateral carotid bifurcation vessels. There is evidence of heterogeneous plaque in the bilateral carotid artery. Mild bilateral intimal thickening.   Echo- 03/22/13  1.Left ventricular cavity is normal in size. Normal global wall motion. Normal systolic global function. Calculated EF 55%. Visual EF is 55-60%.  2. Study is within normal limits.  3.No shunt seen across interatrial septum by color doppler on this study.  4. No thrombus seen in LV or LA on this transthoracic echo. Study  EKG:      EKG 08/15/2020: Marked sinus bradycardia at rate of 47 bpm with first-degree AV block, left atrial enlargement, normal axis.  Poor R wave progression, probably normal variant.  Cannot exclude anteroseptal infarct old.  No evidence of ischemia, normal QT interval.  No significant change from 05/12/2015.     Assessment     ICD-10-CM   1. Amaurosis fugax  G45.3 EKG 12-Lead    PCV CAROTID DUPLEX (BILATERAL)    PCV ECHOCARDIOGRAM COMPLETE  2. TIA (transient ischemic attack)  G45.9 PCV CAROTID DUPLEX (BILATERAL)    PCV ECHOCARDIOGRAM COMPLETE  3. Hypercholesteremia  E78.00   4. Bradycardia by electrocardiogram  R00.1   5. 1st degree AV block  I44.0   6. Stage 3a chronic kidney disease (HCC)  N18.31      Medications Discontinued During This Encounter  Medication Reason  . albuterol (PROVENTIL HFA;VENTOLIN HFA) 108 (90 Base) MCG/ACT inhaler Error  . aspirin 81 MG tablet Error  . glucosamine-chondroitin 500-400 MG tablet Error  . lansoprazole (PREVACID) 30 MG capsule Error  . pravastatin (PRAVACHOL) 40 MG tablet Error  . psyllium (REGULOID) 0.52 G capsule Error  . Spacer/Aero-Holding Chambers (AEROCHAMBER PLUS WITH MASK) inhaler Error  . trimethoprim-polymyxin b (POLYTRIM) ophthalmic solution Error  . cetirizine (ZYRTEC) 10 MG tablet No longer needed (for PRN medications)    No orders of the defined types were placed in this encounter.  Orders Placed This Encounter  Procedures  . EKG 12-Lead  . PCV ECHOCARDIOGRAM COMPLETE    Standing Status:   Future    Standing Expiration Date:   08/15/2021    Recommendations:   DEANTHONY DIZE is a 86 y.o. Caucasian male with history of HTN, hyperlipidemia, and recurrent cortical TIA leading to visual disturbances ongoing since 1990s and last episode in 2014 and no recurrence since lipids controlled with the combination of pravastatin and fenofibric acid. He has been on aspirin and Plavix for many years. He also has asymptomatic profound  bradycardia for many years with HR 35 to 40/min. He remains asymptomatic despite HR consistently high 30s to low 40s.   He was evaluated by  Dr. Monna Fam and after visual examination, she recommended that she follow-up with me.  No change in his physical exam, I will set him up for carotid artery duplex to rescan his carotids and also an echocardiogram in view of amaurosis fugax and TIA.  Fortunately he has remained stable clinically.  Although has profound bradycardia, no clinical suggestion that he needs a pacemaker.  I have discussed with him regarding sinus node dysfunction symptoms.  He is on long-term dual antiplatelet therapy with aspirin and Plavix, as he has remained stable with no recurrence of TIA over many years, if carotid artery duplex is within normal limits, I have recommended we discontinue aspirin and continue Plavix alone to reduce the risk of GI bleed.  I would like to continue to follow him on annual basis.    Adrian Prows, MD, [facility] 08/15/2020, 2:56 PM Office: [phone]  CC: Dr. Monna Fam (Ophth); R. Avva (Int Med)

## 2020-08-19 DIAGNOSIS — Z85828 Personal history of other malignant neoplasm of skin: Secondary | ICD-10-CM | POA: Diagnosis not present

## 2020-08-19 DIAGNOSIS — L720 Epidermal cyst: Secondary | ICD-10-CM | POA: Diagnosis not present

## 2020-08-19 DIAGNOSIS — L821 Other seborrheic keratosis: Secondary | ICD-10-CM | POA: Diagnosis not present

## 2020-08-19 DIAGNOSIS — L570 Actinic keratosis: Secondary | ICD-10-CM | POA: Diagnosis not present

## 2020-08-19 DIAGNOSIS — L812 Freckles: Secondary | ICD-10-CM | POA: Diagnosis not present

## 2020-08-19 DIAGNOSIS — L820 Inflamed seborrheic keratosis: Secondary | ICD-10-CM | POA: Diagnosis not present

## 2020-09-09 ENCOUNTER — Ambulatory Visit: Payer: Medicare Other

## 2020-09-09 ENCOUNTER — Other Ambulatory Visit: Payer: Self-pay

## 2020-09-09 DIAGNOSIS — G453 Amaurosis fugax: Secondary | ICD-10-CM | POA: Diagnosis not present

## 2020-09-09 DIAGNOSIS — G459 Transient cerebral ischemic attack, unspecified: Secondary | ICD-10-CM | POA: Diagnosis not present

## 2020-09-13 ENCOUNTER — Other Ambulatory Visit: Payer: Self-pay | Admitting: Cardiology

## 2020-09-13 DIAGNOSIS — G459 Transient cerebral ischemic attack, unspecified: Secondary | ICD-10-CM

## 2020-09-13 DIAGNOSIS — I6523 Occlusion and stenosis of bilateral carotid arteries: Secondary | ICD-10-CM

## 2020-09-16 NOTE — Progress Notes (Signed)
Called patient, NA, LMAM (Patient has another message in the queue, please address them both.)

## 2020-09-17 NOTE — Progress Notes (Signed)
Called and spoke with patient regarding his echocardiogram results.  ?

## 2020-09-17 NOTE — Progress Notes (Signed)
Called and spoke with patient regarding his CAD results.

## 2020-10-20 ENCOUNTER — Other Ambulatory Visit: Payer: Self-pay

## 2020-10-20 ENCOUNTER — Ambulatory Visit (INDEPENDENT_AMBULATORY_CARE_PROVIDER_SITE_OTHER): Payer: Medicare Other | Admitting: Podiatry

## 2020-10-20 ENCOUNTER — Encounter: Payer: Self-pay | Admitting: Podiatry

## 2020-10-20 DIAGNOSIS — Q828 Other specified congenital malformations of skin: Secondary | ICD-10-CM | POA: Diagnosis not present

## 2020-10-20 DIAGNOSIS — B351 Tinea unguium: Secondary | ICD-10-CM

## 2020-10-20 DIAGNOSIS — M79675 Pain in left toe(s): Secondary | ICD-10-CM | POA: Diagnosis not present

## 2020-10-20 DIAGNOSIS — M79672 Pain in left foot: Secondary | ICD-10-CM

## 2020-10-20 DIAGNOSIS — M79674 Pain in right toe(s): Secondary | ICD-10-CM

## 2020-10-20 NOTE — Progress Notes (Signed)
  Subjective:  Patient ID: David Stanton, male    DOB: 1934-12-08,  MRN: [id number]  86 y.o. male presents with painful porokeratotic lesion left foot and painful, mycotic toenails that are difficult to trim. Pain interferes with ambulation. Aggravating factors include wearing enclosed shoe gear. Pain is relieved with periodic professional debridement. and painful porokeratotic lesions plantar aspect left foot.  Pain prevent comfortable ambulation. Aggravating factor is weightbearing with or without shoegear. Pain relieved with periodic debridement.  He voices no new pedal problems.  Review of Systems: Negative except as noted in the HPI.   No Known Allergies   Objective:   Constitutional Pt is a pleasant 86 y.o. Caucasian male WD, WN in NAD.Marland Kitchen AAO x 3.   Vascular Capillary fill time to digits <3 seconds b/l lower extremities. Palpable pedal pulses b/l LE. Pedal hair sparse. Lower extremity skin temperature gradient within normal limits. No pain with calf compression b/l. No edema noted b/l lower extremities. No cyanosis or clubbing noted.  Neurologic Normal speech. Oriented to person, place, and time. Protective sensation intact 5/5 intact bilaterally with 10g monofilament b/l. Vibratory sensation intact b/l.  Dermatologic Pedal skin with normal turgor, texture and tone bilaterally. No open wounds bilaterally. No interdigital macerations bilaterally. Toenails 1-5 b/l elongated, discolored, dystrophic, thickened, crumbly with subungual debris and tenderness to dorsal palpation. Porokeratotic lesion(s) submet head 5 left foot. No erythema, no edema, no drainage, no flocculence.  Orthopedic: Normal muscle strength 5/5 to all lower extremity muscle groups bilaterally. No pain crepitus or joint limitation noted with ROM b/l. No gross bony deformities bilaterally. Patient ambulates independent of any assistive aids.   Radiographs: None Assessment:   1. Pain due to onychomycosis of toenails of both  feet   2. Porokeratosis   3. Left foot pain    Plan:  Patient was evaluated and treated and all questions answered.  Onychomycosis with pain -Nails palliatively debridement as below. -Educated on self-care  Procedure: Nail Debridement Rationale: Pain Type of Debridement: manual, sharp debridement. Instrumentation: Nail nipper, rotary burr. Number of Nails: 10 -Examined patient. -No new findings. No new orders. -Medicare ABN signed for 2022 for paring of lesion left foot. Patient given copy for his records and copy placed in patient's chart.  -Toenails 1-5 b/l were debrided in length and girth with sterile nail nippers and dremel without iatrogenic bleeding.  -Painful porokeratotic lesion(s) submet head 5 left foot pared and enucleated with sterile scalpel blade without incident. Signed ABN on file. -Patient to report any pedal injuries to medical professional immediately. -Patient to continue soft, supportive shoe gear daily. -Patient/POA to call should there be question/concern in the interim.  Return in about 3 months (around 01/20/2021).  Marzetta Board, DPM

## 2020-10-31 DIAGNOSIS — E785 Hyperlipidemia, unspecified: Secondary | ICD-10-CM | POA: Diagnosis not present

## 2020-10-31 DIAGNOSIS — N400 Enlarged prostate without lower urinary tract symptoms: Secondary | ICD-10-CM | POA: Diagnosis not present

## 2020-10-31 DIAGNOSIS — H348192 Central retinal vein occlusion, unspecified eye, stable: Secondary | ICD-10-CM | POA: Diagnosis not present

## 2020-10-31 DIAGNOSIS — R195 Other fecal abnormalities: Secondary | ICD-10-CM | POA: Diagnosis not present

## 2020-10-31 DIAGNOSIS — M1990 Unspecified osteoarthritis, unspecified site: Secondary | ICD-10-CM | POA: Diagnosis not present

## 2020-10-31 DIAGNOSIS — N1832 Chronic kidney disease, stage 3b: Secondary | ICD-10-CM | POA: Diagnosis not present

## 2020-10-31 DIAGNOSIS — R279 Unspecified lack of coordination: Secondary | ICD-10-CM | POA: Diagnosis not present

## 2020-10-31 DIAGNOSIS — K219 Gastro-esophageal reflux disease without esophagitis: Secondary | ICD-10-CM | POA: Diagnosis not present

## 2020-10-31 DIAGNOSIS — G459 Transient cerebral ischemic attack, unspecified: Secondary | ICD-10-CM | POA: Diagnosis not present

## 2020-10-31 DIAGNOSIS — J302 Other seasonal allergic rhinitis: Secondary | ICD-10-CM | POA: Diagnosis not present

## 2021-02-03 ENCOUNTER — Ambulatory Visit (INDEPENDENT_AMBULATORY_CARE_PROVIDER_SITE_OTHER): Payer: Medicare Other | Admitting: Podiatry

## 2021-02-03 ENCOUNTER — Encounter: Payer: Self-pay | Admitting: Podiatry

## 2021-02-03 ENCOUNTER — Other Ambulatory Visit: Payer: Self-pay

## 2021-02-03 DIAGNOSIS — E782 Mixed hyperlipidemia: Secondary | ICD-10-CM | POA: Insufficient documentation

## 2021-02-03 DIAGNOSIS — K219 Gastro-esophageal reflux disease without esophagitis: Secondary | ICD-10-CM | POA: Insufficient documentation

## 2021-02-03 DIAGNOSIS — B351 Tinea unguium: Secondary | ICD-10-CM

## 2021-02-03 DIAGNOSIS — M79675 Pain in left toe(s): Secondary | ICD-10-CM

## 2021-02-03 DIAGNOSIS — K922 Gastrointestinal hemorrhage, unspecified: Secondary | ICD-10-CM | POA: Insufficient documentation

## 2021-02-03 DIAGNOSIS — Z23 Encounter for immunization: Secondary | ICD-10-CM | POA: Insufficient documentation

## 2021-02-03 DIAGNOSIS — Q828 Other specified congenital malformations of skin: Secondary | ICD-10-CM | POA: Diagnosis not present

## 2021-02-03 DIAGNOSIS — Z125 Encounter for screening for malignant neoplasm of prostate: Secondary | ICD-10-CM | POA: Diagnosis not present

## 2021-02-03 DIAGNOSIS — M79674 Pain in right toe(s): Secondary | ICD-10-CM

## 2021-02-03 DIAGNOSIS — H9190 Unspecified hearing loss, unspecified ear: Secondary | ICD-10-CM | POA: Insufficient documentation

## 2021-02-03 DIAGNOSIS — E785 Hyperlipidemia, unspecified: Secondary | ICD-10-CM | POA: Diagnosis not present

## 2021-02-03 DIAGNOSIS — M79672 Pain in left foot: Secondary | ICD-10-CM

## 2021-02-03 DIAGNOSIS — N1832 Chronic kidney disease, stage 3b: Secondary | ICD-10-CM | POA: Insufficient documentation

## 2021-02-03 DIAGNOSIS — Z461 Encounter for fitting and adjustment of hearing aid: Secondary | ICD-10-CM | POA: Insufficient documentation

## 2021-02-06 NOTE — Progress Notes (Signed)
Subjective: David Stanton is a pleasant 86 y.o. male patient seen today for painful porokeratotic lesion of left foot and painful thick toenails that are difficult to trim. Pain interferes with ambulation. Aggravating factors include wearing enclosed shoe gear. Pain is relieved with periodic professional debridement.  He relates no new problems on today's visit.  PCP is Avva, Steva Ready, MD. Last visit was: 02/03/2021.  No Known Allergies  Objective: Physical Exam  General: David Stanton is a pleasant 86 y.o. Caucasian male, in NAD. AAO x 3.   Vascular:  Capillary fill time to digits <3 seconds b/l lower extremities. Palpable DP pulse(s) b/l lower extremities Palpable PT pulse(s) b/l lower extremities Pedal hair sparse. Lower extremity skin temperature gradient within normal limits. No pain with calf compression LLE. No edema noted b/l lower extremities.  Dermatological:  Pedal skin with normal turgor, texture and tone b/l lower extremities No open wounds b/l lower extremities No interdigital macerations b/l lower extremities Toenails 1-5 b/l elongated, discolored, dystrophic, thickened, crumbly with subungual debris and tenderness to dorsal palpation. Porokeratotic lesion(s) submet head 5 left foot. No erythema, no edema, no drainage, no fluctuance.  Musculoskeletal:  Normal muscle strength 5/5 to all lower extremity muscle groups bilaterally. No pain crepitus or joint limitation noted with ROM b/l. No gross bony deformities bilaterally.  Neurological:  Protective sensation intact 5/5 intact bilaterally with 10g monofilament b/l. Vibratory sensation intact b/l.  Assessment and Plan:  1. Pain due to onychomycosis of toenails of both feet   2. Porokeratosis   3. Left foot pain     -Examined patient. -Medicare ABN signed for this year. Patient consents for services of paring of porokeratotic lesion of left foot  today. Copy has been placed in patient's chart. -Patient to continue  soft, supportive shoe gear daily. -Toenails 1-5 b/l were debrided in length and girth with sterile nail nippers and dremel without iatrogenic bleeding.  -Painful porokeratotic lesion(s) submet head 5 left foot pared and enucleated with sterile scalpel blade without incident. Total number of lesions debrided=1. -Patient to report any pedal injuries to medical professional immediately. -Patient/POA to call should there be question/concern in the interim.  Return in about 3 months (around 05/06/2021).  Marzetta Board, DPM

## 2021-02-10 DIAGNOSIS — J302 Other seasonal allergic rhinitis: Secondary | ICD-10-CM | POA: Diagnosis not present

## 2021-02-10 DIAGNOSIS — K219 Gastro-esophageal reflux disease without esophagitis: Secondary | ICD-10-CM | POA: Diagnosis not present

## 2021-02-10 DIAGNOSIS — R195 Other fecal abnormalities: Secondary | ICD-10-CM | POA: Diagnosis not present

## 2021-02-10 DIAGNOSIS — G459 Transient cerebral ischemic attack, unspecified: Secondary | ICD-10-CM | POA: Diagnosis not present

## 2021-02-10 DIAGNOSIS — E785 Hyperlipidemia, unspecified: Secondary | ICD-10-CM | POA: Diagnosis not present

## 2021-02-10 DIAGNOSIS — R279 Unspecified lack of coordination: Secondary | ICD-10-CM | POA: Diagnosis not present

## 2021-02-10 DIAGNOSIS — Z0000 Encounter for general adult medical examination without abnormal findings: Secondary | ICD-10-CM | POA: Diagnosis not present

## 2021-02-10 DIAGNOSIS — N1832 Chronic kidney disease, stage 3b: Secondary | ICD-10-CM | POA: Diagnosis not present

## 2021-02-10 DIAGNOSIS — H348192 Central retinal vein occlusion, unspecified eye, stable: Secondary | ICD-10-CM | POA: Diagnosis not present

## 2021-02-10 DIAGNOSIS — R82998 Other abnormal findings in urine: Secondary | ICD-10-CM | POA: Diagnosis not present

## 2021-02-10 DIAGNOSIS — M1990 Unspecified osteoarthritis, unspecified site: Secondary | ICD-10-CM | POA: Diagnosis not present

## 2021-02-10 DIAGNOSIS — H9202 Otalgia, left ear: Secondary | ICD-10-CM | POA: Diagnosis not present

## 2021-02-10 DIAGNOSIS — N400 Enlarged prostate without lower urinary tract symptoms: Secondary | ICD-10-CM | POA: Diagnosis not present

## 2021-02-11 DIAGNOSIS — H40013 Open angle with borderline findings, low risk, bilateral: Secondary | ICD-10-CM | POA: Diagnosis not present

## 2021-02-11 DIAGNOSIS — H2513 Age-related nuclear cataract, bilateral: Secondary | ICD-10-CM | POA: Diagnosis not present

## 2021-02-11 DIAGNOSIS — H353122 Nonexudative age-related macular degeneration, left eye, intermediate dry stage: Secondary | ICD-10-CM | POA: Diagnosis not present

## 2021-02-11 DIAGNOSIS — H348112 Central retinal vein occlusion, right eye, stable: Secondary | ICD-10-CM | POA: Diagnosis not present

## 2021-04-30 DIAGNOSIS — Z23 Encounter for immunization: Secondary | ICD-10-CM | POA: Diagnosis not present

## 2021-05-11 ENCOUNTER — Other Ambulatory Visit: Payer: Self-pay

## 2021-05-11 ENCOUNTER — Ambulatory Visit (INDEPENDENT_AMBULATORY_CARE_PROVIDER_SITE_OTHER): Payer: Medicare Other | Admitting: Podiatry

## 2021-05-11 DIAGNOSIS — M79674 Pain in right toe(s): Secondary | ICD-10-CM | POA: Diagnosis not present

## 2021-05-11 DIAGNOSIS — B351 Tinea unguium: Secondary | ICD-10-CM | POA: Diagnosis not present

## 2021-05-11 DIAGNOSIS — M79672 Pain in left foot: Secondary | ICD-10-CM

## 2021-05-11 DIAGNOSIS — M79675 Pain in left toe(s): Secondary | ICD-10-CM | POA: Diagnosis not present

## 2021-05-11 DIAGNOSIS — Q828 Other specified congenital malformations of skin: Secondary | ICD-10-CM

## 2021-05-15 ENCOUNTER — Encounter: Payer: Self-pay | Admitting: Podiatry

## 2021-05-15 DIAGNOSIS — H9313 Tinnitus, bilateral: Secondary | ICD-10-CM | POA: Insufficient documentation

## 2021-05-15 DIAGNOSIS — H3560 Retinal hemorrhage, unspecified eye: Secondary | ICD-10-CM | POA: Insufficient documentation

## 2021-05-15 DIAGNOSIS — H353132 Nonexudative age-related macular degeneration, bilateral, intermediate dry stage: Secondary | ICD-10-CM | POA: Insufficient documentation

## 2021-05-15 NOTE — Progress Notes (Signed)
  Subjective:  Patient ID: David Stanton, male    DOB: 1934-11-12,  MRN: [id number]  David Stanton presents to clinic today for painful porokeratotic lesion(s) left foot and painful mycotic toenails that limit ambulation. Painful toenails interfere with ambulation. Aggravating factors include wearing enclosed shoe gear. Pain is relieved with periodic professional debridement. Painful porokeratotic lesions are aggravated when weightbearing with and without shoegear. Pain is relieved with periodic professional debridement.  He voices no new pedal problems on today's visit.  PCP is Avva, Ravisankar, MD , and last visit was 10/31/2020.  No Known Allergies  Review of Systems: Negative except as noted in the HPI. Objective:   Constitutional [name] is a pleasant 86 y.o. Caucasian male, WD, WN in NAD. AAO x 3.   Vascular Capillary fill time to digits <3 seconds b/l lower extremities. Palpable pedal pulses b/l LE. Pedal hair present. Lower extremity skin temperature gradient within normal limits. No pain with calf compression b/l. No edema noted b/l lower extremities. No cyanosis or clubbing noted.  Neurologic Normal speech. Oriented to person, place, and time. Protective sensation intact 5/5 intact bilaterally with 10g monofilament b/l. Vibratory sensation intact b/l.  Dermatologic Skin warm and supple b/l lower extremities. No open wounds b/l LE. No interdigital macerations b/l lower extremities. Toenails 1-5 b/l elongated, discolored, dystrophic, thickened, crumbly with subungual debris and tenderness to dorsal palpation. Porokeratotic lesion(s) submet head 5 left foot. No erythema, no edema, no drainage, no fluctuance.  Orthopedic: Normal muscle strength 5/5 to all lower extremity muscle groups bilaterally. No gross bony deformities b/l lower extremities.   Radiographs: None Assessment:   1. Pain due to onychomycosis of toenails of both feet   2. Porokeratosis   3. Left foot pain     Plan:  Patient was evaluated and treated and all questions answered. Consent given for treatment as described below: -No new findings. No new orders. -Medicare ABN signed for this year. Patient consents for services of paring of porokeratosis of left foot  today. Copy has been placed in patient's chart. -Patient to continue soft, supportive shoe gear daily. -Toenails 1-5 b/l were debrided in length and girth with sterile nail nippers and dremel without iatrogenic bleeding.  -Painful porokeratotic lesion(s) submet head 5 left foot pared and enucleated with sterile scalpel blade without incident. Total number of lesions debrided=1. -Patient to report any pedal injuries to medical professional immediately. -Patient/POA to call should there be question/concern in the interim.  Return in about 3 months (around 08/11/2021).  Marzetta Board, DPM

## 2021-07-16 DIAGNOSIS — K219 Gastro-esophageal reflux disease without esophagitis: Secondary | ICD-10-CM | POA: Diagnosis not present

## 2021-07-16 DIAGNOSIS — R051 Acute cough: Secondary | ICD-10-CM | POA: Diagnosis not present

## 2021-07-16 DIAGNOSIS — U071 COVID-19: Secondary | ICD-10-CM | POA: Diagnosis not present

## 2021-07-16 DIAGNOSIS — N1832 Chronic kidney disease, stage 3b: Secondary | ICD-10-CM | POA: Diagnosis not present

## 2021-07-16 DIAGNOSIS — R0981 Nasal congestion: Secondary | ICD-10-CM | POA: Diagnosis not present

## 2021-07-16 DIAGNOSIS — Z1152 Encounter for screening for COVID-19: Secondary | ICD-10-CM | POA: Diagnosis not present

## 2021-07-16 DIAGNOSIS — R5383 Other fatigue: Secondary | ICD-10-CM | POA: Diagnosis not present

## 2021-07-16 DIAGNOSIS — J029 Acute pharyngitis, unspecified: Secondary | ICD-10-CM | POA: Diagnosis not present

## 2021-08-11 DIAGNOSIS — G459 Transient cerebral ischemic attack, unspecified: Secondary | ICD-10-CM | POA: Diagnosis not present

## 2021-08-11 DIAGNOSIS — J302 Other seasonal allergic rhinitis: Secondary | ICD-10-CM | POA: Diagnosis not present

## 2021-08-11 DIAGNOSIS — R195 Other fecal abnormalities: Secondary | ICD-10-CM | POA: Diagnosis not present

## 2021-08-11 DIAGNOSIS — H348192 Central retinal vein occlusion, unspecified eye, stable: Secondary | ICD-10-CM | POA: Diagnosis not present

## 2021-08-11 DIAGNOSIS — K219 Gastro-esophageal reflux disease without esophagitis: Secondary | ICD-10-CM | POA: Diagnosis not present

## 2021-08-11 DIAGNOSIS — E785 Hyperlipidemia, unspecified: Secondary | ICD-10-CM | POA: Diagnosis not present

## 2021-08-11 DIAGNOSIS — N400 Enlarged prostate without lower urinary tract symptoms: Secondary | ICD-10-CM | POA: Diagnosis not present

## 2021-08-11 DIAGNOSIS — M1990 Unspecified osteoarthritis, unspecified site: Secondary | ICD-10-CM | POA: Diagnosis not present

## 2021-08-11 DIAGNOSIS — N1832 Chronic kidney disease, stage 3b: Secondary | ICD-10-CM | POA: Diagnosis not present

## 2021-08-11 DIAGNOSIS — R279 Unspecified lack of coordination: Secondary | ICD-10-CM | POA: Diagnosis not present

## 2021-08-18 ENCOUNTER — Ambulatory Visit (INDEPENDENT_AMBULATORY_CARE_PROVIDER_SITE_OTHER): Payer: Medicare Other | Admitting: Podiatry

## 2021-08-18 ENCOUNTER — Other Ambulatory Visit: Payer: Self-pay

## 2021-08-18 DIAGNOSIS — B351 Tinea unguium: Secondary | ICD-10-CM

## 2021-08-18 DIAGNOSIS — Q828 Other specified congenital malformations of skin: Secondary | ICD-10-CM | POA: Diagnosis not present

## 2021-08-18 DIAGNOSIS — M79675 Pain in left toe(s): Secondary | ICD-10-CM

## 2021-08-18 DIAGNOSIS — L600 Ingrowing nail: Secondary | ICD-10-CM

## 2021-08-18 DIAGNOSIS — M79674 Pain in right toe(s): Secondary | ICD-10-CM

## 2021-08-18 DIAGNOSIS — M79672 Pain in left foot: Secondary | ICD-10-CM | POA: Diagnosis not present

## 2021-08-18 DIAGNOSIS — U071 COVID-19: Secondary | ICD-10-CM | POA: Insufficient documentation

## 2021-08-19 DIAGNOSIS — L853 Xerosis cutis: Secondary | ICD-10-CM | POA: Diagnosis not present

## 2021-08-19 DIAGNOSIS — L570 Actinic keratosis: Secondary | ICD-10-CM | POA: Diagnosis not present

## 2021-08-19 DIAGNOSIS — L821 Other seborrheic keratosis: Secondary | ICD-10-CM | POA: Diagnosis not present

## 2021-08-19 DIAGNOSIS — Z85828 Personal history of other malignant neoplasm of skin: Secondary | ICD-10-CM | POA: Diagnosis not present

## 2021-08-20 ENCOUNTER — Encounter: Payer: Self-pay | Admitting: Cardiology

## 2021-08-20 ENCOUNTER — Ambulatory Visit: Payer: Medicare Other | Admitting: Cardiology

## 2021-08-20 ENCOUNTER — Other Ambulatory Visit: Payer: Self-pay

## 2021-08-20 VITALS — BP 130/75 | HR 55 | Temp 98.0°F | Resp 16 | Ht 72.0 in | Wt 194.0 lb

## 2021-08-20 DIAGNOSIS — N1831 Chronic kidney disease, stage 3a: Secondary | ICD-10-CM | POA: Diagnosis not present

## 2021-08-20 DIAGNOSIS — E7800 Pure hypercholesterolemia, unspecified: Secondary | ICD-10-CM

## 2021-08-20 DIAGNOSIS — R001 Bradycardia, unspecified: Secondary | ICD-10-CM

## 2021-08-20 DIAGNOSIS — G459 Transient cerebral ischemic attack, unspecified: Secondary | ICD-10-CM | POA: Diagnosis not present

## 2021-08-20 DIAGNOSIS — G453 Amaurosis fugax: Secondary | ICD-10-CM | POA: Diagnosis not present

## 2021-08-20 NOTE — Progress Notes (Signed)
Primary Physician/Referring:  Prince Solian, MD  Patient ID: David Stanton, male    DOB: 07/29/1935, 87 y.o.   MRN: [id number]  Chief Complaint  Patient presents with   Carotid stenosis   Bradycardia   HPI:    David Stanton  is a 87 y.o.  Caucasian male with history of HTN, hyperlipidemia, and recurrent cortical TIA leading to visual disturbances ongoing since 1990s and last episode in 2014 and no recurrence since lipids controlled with the combination of pravastatin and fenofibric acid. He also has asymptomatic profound bradycardia for many years with HR 35 to 40/min. He remains asymptomatic despite HR consistently high 30s to low 40s.  In view of retinal changes last year, I performed carotid artery duplex with continued mild carotid disease.  He remains asymptomatic and continues to be active.  He had COVID 19 infection in December 2022 and is recuperating from this gradually over the past 1 month.   Past Medical History:  Diagnosis Date   High cholesterol    Retinal hemorrhage    Past Surgical History:  Procedure Laterality Date   EYE SURGERY     HERNIA REPAIR     KNEE SURGERY     Family History  Problem Relation Age of Onset   Stroke Mother    Cancer Father    Cancer Son    Stomach cancer Son     Social History   Tobacco Use   Smoking status: Former    Packs/day: 1.00    Years: 19.00    Pack years: 19.00    Types: Cigarettes    Quit date: 1980    Years since quitting: 43.0   Smokeless tobacco: Never  Substance Use Topics   Alcohol use: Yes    Comment: occ   Marital Status: Married   ROS  Review of Systems  Cardiovascular:  Negative for chest pain, dyspnea on exertion and leg swelling.  Gastrointestinal:  Negative for melena.  Objective  Blood pressure 130/75, pulse (!) 55, temperature 98 F (36.7 C), temperature source Temporal, resp. rate 16, height 6' (1.829 m), weight 194 lb (88 kg), SpO2 97 %.  Vitals with BMI 08/20/2021 08/15/2020 07/11/2020   Height _0  _1  _2   Weight 194 lbs 198 lbs 194 lbs  BMI 26.31 94.49 67.59  Systolic 163 846 659  Diastolic 75 81 57  Pulse 55 47 70     Physical Exam Cardiovascular:     Rate and Rhythm: Regular rhythm. Bradycardia present.     Pulses: Intact distal pulses.     Heart sounds: Normal heart sounds. No murmur heard.   No gallop.     Comments: No leg edema, no JVD. Pulmonary:     Effort: Pulmonary effort is normal.     Breath sounds: Normal breath sounds.  Abdominal:     General: Bowel sounds are normal.     Palpations: Abdomen is soft.   Laboratory examination:   No results for input(s): NA, K, CL, CO2, GLUCOSE, BUN, CREATININE, CALCIUM, GFRNONAA, GFRAA in the last 8760 hours.  CrCl cannot be calculated (Patient's most recent lab result is older than the maximum 21 days allowed.).  CMP Latest Ref Rng & Units 02/01/2020  Glucose 70 - 99 mg/dL 124(H)  BUN 8 - 23 mg/dL 19  Creatinine 0.61 - 1.24 mg/dL 1.55(H)  Sodium 135 - 145 mmol/L 140  Potassium 3.5 - 5.1 mmol/L 4.4  Chloride 98 - 111 mmol/L 100  CO2 22 -  32 mmol/L 27  Calcium 8.9 - 10.3 mg/dL 9.8  Total Protein 6.5 - 8.1 g/dL 8.2(H)  Total Bilirubin 0.3 - 1.2 mg/dL 1.1  Alkaline Phos 38 - 126 U/L 42  AST 15 - 41 U/L 22  ALT 0 - 44 U/L 16   CBC Latest Ref Rng & Units 02/01/2020  WBC 4.0 - 10.5 K/uL 12.4(H)  Hemoglobin 13.0 - 17.0 g/dL 15.1  Hematocrit 39.0 - 52.0 % 44.6  Platelets 150 - 400 K/uL 208   External labs:   Labs 08/11/2021:  Serum glucose 120 mg, BUN 17, creatinine 1.4, EGFR 48 mL, potassium 4.5.  CMP normal otherwise normal.  Hb 13.2/HCT 38.9, platelets 181.  Normal indicis.  Cholesterol, total 122.000 m 12/14/2019 HDL 46 MG/DL 12/14/2019 LDL 55.000 mg 12/14/2019 Triglycerides 104.000 12/14/2019  Creatinine, Serum 1.800 mg/ 02/20/2020 Potassium 4.400 mm 02/01/2020 ALT (SGPT) 16.000 U/L 02/01/2020  TSH 2.230 12/14/2019  Medications and allergies   Allergies  Allergen Reactions   Omeprazole      Other reaction(s): The addivities that are in the medicine   Tamsulosin     Other reaction(s): Vertigo     Outpatient Medications Prior to Visit  Medication Sig Dispense Refill   acetaminophen (TYLENOL) 500 MG tablet Take 500 mg by mouth every 6 (six) hours as needed.     cetirizine (ZYRTEC) 10 MG tablet Take 1 tablet by mouth daily as needed.     clobetasol cream (TEMOVATE) 8.92 % Apply 1 application topically 2 (two) times daily.     clopidogrel (PLAVIX) 75 MG tablet Take 1 tablet by mouth daily.     diclofenac Sodium (VOLTAREN) 1 % GEL      dutasteride (AVODART) 0.5 MG capsule Take 0.5 mg by mouth daily.     fenofibrate (TRICOR) 48 MG tablet Take 1 tablet by mouth daily.     fluorouracil (EFUDEX) 5 % cream APPLY TO THE AFFECTED AREAS NIGHTLY FOR 14 DAYS     Multiple Vitamins-Minerals (PRESERVISION AREDS 2) CAPS See admin instructions.     pantoprazole (PROTONIX) 40 MG tablet Take 1 tablet by mouth every 12 (twelve) hours.     pravastatin (PRAVACHOL) 40 MG tablet Take 1 tablet by mouth at bedtime.     acetaminophen (TYLENOL) 500 MG tablet Take 1 tablet by mouth daily as needed.     clopidogrel (PLAVIX) 75 MG tablet Take 75 mg by mouth daily.     COVID-19 mRNA vaccine, Moderna, 100 MCG/0.5ML injection      dutasteride (AVODART) 0.5 MG capsule Take 1 capsule by mouth at bedtime.     dutasteride (AVODART) 0.5 MG capsule Take 1 capsule by mouth daily.     famotidine (PEPCID AC) 10 MG tablet uses PRN     fenofibrate (TRICOR) 48 MG tablet Take 48 mg by mouth daily.     fluticasone (FLONASE) 50 MCG/ACT nasal spray Place 1 spray into both nostrils 2 (two) times daily.     Glucosamine-Chondroit-Vit C-Mn (GLUCOSAMINE CHONDR 500 COMPLEX) CAPS Take 2 capsules in the morning     ondansetron (ZOFRAN-ODT) 4 MG disintegrating tablet      pantoprazole (PROTONIX) 40 MG tablet Take 40 mg by mouth daily.     polyethylene glycol-electrolytes (NULYTELY) 420 g solution Take 420 g by mouth as directed.      No facility-administered medications prior to visit.    Radiology:   CT scan of the abdomen 02/01/2020: 1. Focal region of mid to upper mesenteric hazy stranding with numerous reactive appearing  clustered mid mesenteric lymph nodes compatible with mesenteritis. 2. Moderate colonic stool burden with a small rectal stool ball measuring up to 6.4 cm in diameter without perirectal inflammation. Correlate for features of constipation/slowed intestinal transit. 3. Few scattered colonic diverticula without evidence of acute diverticulitis. Colonic diverticula can provide a source of lower GI bleeding in the absence of inflammation. 4. No other acute abdominopelvic abnormality. 5. Subpleural reticulations in the lung bases could reflect developing interstitial fibrotic changes. Could be better characterized with nonemergent outpatient HRCT if clinically warranted. 6. Aortic Atherosclerosis   Cardiac Studies:   Stress EKG 04/03/2013: Indications: Bradycardia, Screening for CAD. Conclusions: Normal HR response to exercise. Normal BP. Good exercise tolerence. Continue primary prevention. No indication for pacemaker. Symptoms: THR reached. No symptoms. Arrhythmia: PAC and Occasional non conducted PAC. The patient exercised according to the Bruce protocol, Total time recorded 6 Min. 2 sec. achieving a max heart rate of 172 which was 119% of MPHR for age and 7.3 METS of work. 3 mm ST depression back to baseline immediately into recovery  Carotid artery duplex 09/09/2020: Stenosis in the right internal carotid artery (16-49%). Stenosis in the right external carotid artery (<50%). Stenosis in the left internal carotid artery (1-15%). Moderate heterogeneous plaque. noted bilaterally.  Antegrade right vertebral artery flow. Antegrade left vertebral artery flow. Compared to the study done on 03/29/2013, there is mild progression of disease on the right. Follow up in one year is appropriate if clinically  indicated.  PCV ECHOCARDIOGRAM COMPLETE 09/09/2020  Narrative Echocardiogram 09/09/2020: Normal LV systolic function with visual EF 55-60%. Left ventricle cavity is normal in size. Normal global wall motion. Normal diastolic filling pattern, normal LAP. Mild (Grade I) mitral regurgitation. Mild tricuspid regurgitation. No evidence of pulmonary hypertension. Compared to prior study dated 03/22/2013: no significant change except Trace MR and TR are now Mild in severity.     EKG:   EKG 08/20/2021: Sinus bradycardia at rate of 50 bpm, first-degree AV block.  Normal axis, incomplete right bundle branch block.  No significant change from 08/15/2020, previously heart rate was 47 bpm.  Assessment     ICD-10-CM   1. Amaurosis fugax  G45.3 EKG 12-Lead    2. TIA (transient ischemic attack)  G45.9     3. Hypercholesteremia  E78.00     4. Bradycardia by electrocardiogram  R00.1     5. Stage 3a chronic kidney disease (HCC)  N18.31        Medications Discontinued During This Encounter  Medication Reason   clopidogrel (PLAVIX) 75 MG tablet    dutasteride (AVODART) 0.5 MG capsule    dutasteride (AVODART) 0.5 MG capsule    fenofibrate (TRICOR) 48 MG tablet    pantoprazole (PROTONIX) 40 MG tablet    acetaminophen (TYLENOL) 500 MG tablet    COVID-19 mRNA vaccine, Moderna, 100 MCG/0.5ML injection    ondansetron (ZOFRAN-ODT) 4 MG disintegrating tablet    Glucosamine-Chondroit-Vit C-Mn (GLUCOSAMINE CHONDR 500 COMPLEX) CAPS    fluticasone (FLONASE) 50 MCG/ACT nasal spray    famotidine (PEPCID AC) 10 MG tablet    polyethylene glycol-electrolytes (NULYTELY) 420 g solution     No orders of the defined types were placed in this encounter.  Orders Placed This Encounter  Procedures   EKG 12-Lead    Recommendations:   David Stanton is a 87 y.o. Caucasian male with history of HTN, hyperlipidemia, and recurrent cortical TIA leading to visual disturbances ongoing since 1990s and last episode in  2014 and no recurrence  since lipids controlled with the combination of pravastatin and fenofibric acid. He also has asymptomatic profound bradycardia for many years with HR 35 to 40/min. He remains asymptomatic despite HR consistently high 30s to low 40s.  In view of retinal changes last year, I performed carotid artery duplex with continued mild carotid disease.  Reviewed his external labs, renal function has remained stable and lipids being managed by his PCP and LDL is at target with a combination of statins and fenofibric acid.  I will repeat his carotid artery duplex as its been a year for surveillance.  No change in his physical exam.  Blood pressure is also well controlled.  I will see him back in a year or sooner if problems.  If he remains stable I will see him back on a as needed basis.  On his last office visit, he was on dual antiplatelet therapy with aspirin and Plavix for many years I discontinued aspirin, fortunately he has not had any recurrence of TIA.  He will continue with Plavix indefinitely for now.  No change in his physical exam since prior office visit.   Adrian Prows, MD, [facility] 08/20/2021, 11:45 AM Office: [phone]  CC: Dr. Monna Fam (Ophth); R. Avva (Int Med)

## 2021-08-23 ENCOUNTER — Encounter: Payer: Self-pay | Admitting: Podiatry

## 2021-08-23 NOTE — Progress Notes (Signed)
Subjective: David Stanton is a pleasant 87 y.o. male patient seen today for painful porokeratotic lesion(s) left foot and painful mycotic toenails that limit ambulation. Painful toenails interfere with ambulation. Aggravating factors include wearing enclosed shoe gear. Pain is relieved with periodic professional debridement. Painful porokeratotic lesions are aggravated when weightbearing with and without shoegear. Pain is relieved with periodic professional debridement.   Patient  relates no new pedal problems on today's visit.  PCP is Avva, Steva Ready, MD. Last visit was: 10/31/2020.  Allergies  Allergen Reactions   Omeprazole     Other reaction(s): The addivities that are in the medicine   Tamsulosin     Other reaction(s): Vertigo    Objective: Physical Exam  General: David Stanton is a pleasant 87 y.o. male, WD, WN in NAD. AAO x 3.   Vascular:  CFT <3 seconds b/l LE. Palpable DP/PT pulses b/l LE. Digital hair present b/l. Skin temperature gradient WNL b/l. No pain with calf compression b/l. No edema noted b/l. No cyanosis or clubbing noted b/l LE.   Dermatological:  Pedal integument with normal turgor, texture and tone b/l LE. No open wounds b/l. No interdigital macerations b/l. Toenails 1-5 b/l elongated, thickened, discolored with subungual debris. +Tenderness with dorsal palpation of nailplates. Porokeratotic lesion(s) noted submet head 5 left foot. Incurvated nailplate lateral border(s) L hallux.  Nail border hypertrophy minimal. There is tenderness to palpation. Sign(s) of infection: no clinical signs of infection noted on examination today..   Musculoskeletal:  Normal muscle strength 5/5 to all lower extremity muscle groups bilaterally. No pain, crepitus or joint limitation noted with ROM b/l LE. No gross bony pedal deformities b/l. Patient ambulates independently without assistive aids.   Neurological:  Protective sensation intact 5/5 intact bilaterally with 10g  monofilament b/l. Vibratory sensation intact b/l. Proprioception intact bilaterally.   Assessment and Plan:  1. Pain due to onychomycosis of toenails of both feet   2. Porokeratosis   3. Ingrown toenail without infection   4. Left foot pain   Patient was evaluated and treated and all questions answered. Consent given for treatment as described below: -Medicare ABN on file for paring of porokeratosis left foot. -Mycotic toenails 1-5 bilaterally were debrided in length and girth with sterile nail nippers and dremel without incident. -Offending nail border debrided and curretaged L hallux utilizing sterile nail nipper and currette. Border(s) cleansed with alcohol and triple antibiotic ointment applied. Patient instructed to apply Neosporin Cream  to L hallux once daily for 7 days. -Painful porokeratotic lesion(s) submet head 5 left foot pared and enucleated with sterile scalpel blade without incident. Total number of lesions debrided=1. -Patient/POA to call should there be question/concern in the interim.  Return in about 3 months (around 11/16/2021).  Marzetta Board, DPM

## 2021-08-28 ENCOUNTER — Ambulatory Visit: Payer: Medicare Other

## 2021-08-28 ENCOUNTER — Other Ambulatory Visit: Payer: Self-pay

## 2021-08-28 DIAGNOSIS — G459 Transient cerebral ischemic attack, unspecified: Secondary | ICD-10-CM | POA: Diagnosis not present

## 2021-08-28 DIAGNOSIS — I6523 Occlusion and stenosis of bilateral carotid arteries: Secondary | ICD-10-CM

## 2021-09-14 IMAGING — CT CT ABD-PELV W/O CM
2 of 4 series · 14 of 46 positions shown, 16 images · non-contrast
Comparison: None

CLINICAL DATA: Abdominal pain, nausea, vomiting and constipation
with blood in stool.

EXAM:
CT ABDOMEN AND PELVIS WITHOUT CONTRAST
TECHNIQUE: Multidetector CT imaging of the abdomen and pelvis was performed
following the standard protocol without IV contrast.

[Series 2: axial st · axial · 0.83mm/px · z∈[+548,+963]mm · 11 of 99 slices shown, 13 images]
[im 8/99  soft-tissue]
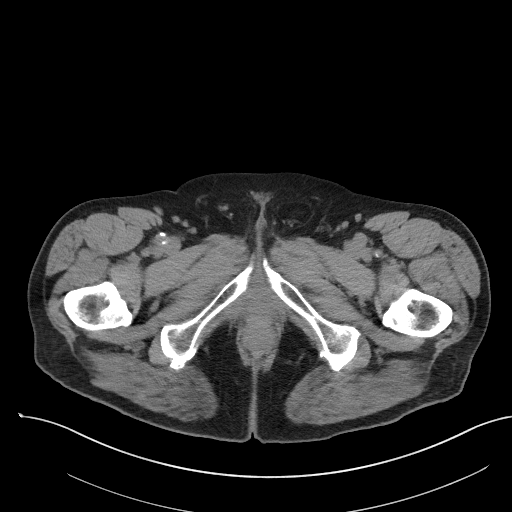
[im 8/99  bone]
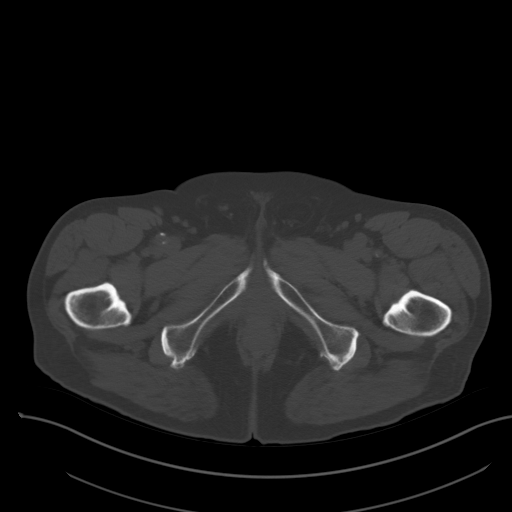
[im 16/99  soft-tissue]
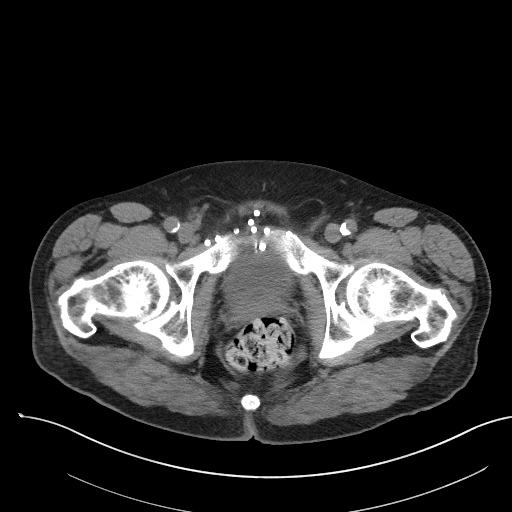
[im 24/99  soft-tissue]
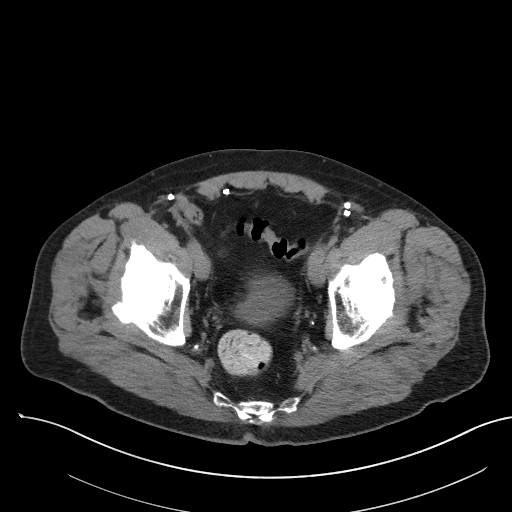
[im 32/99  soft-tissue]
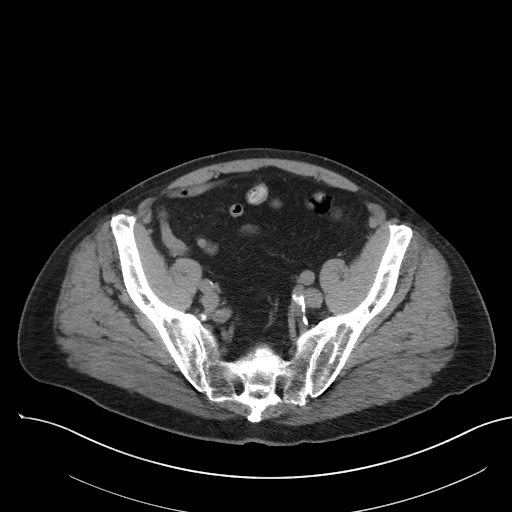
[im 40/99  soft-tissue]
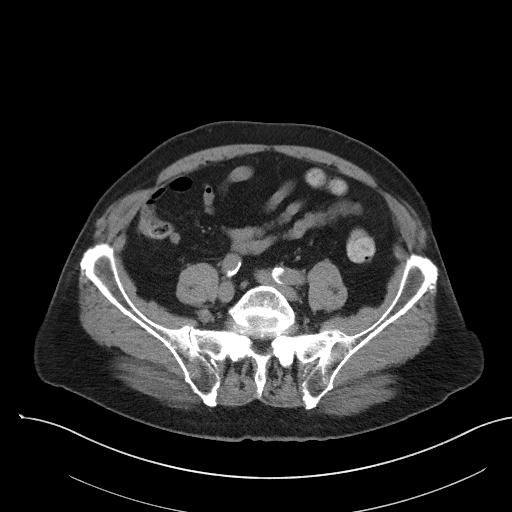
[im 51/99  soft-tissue]
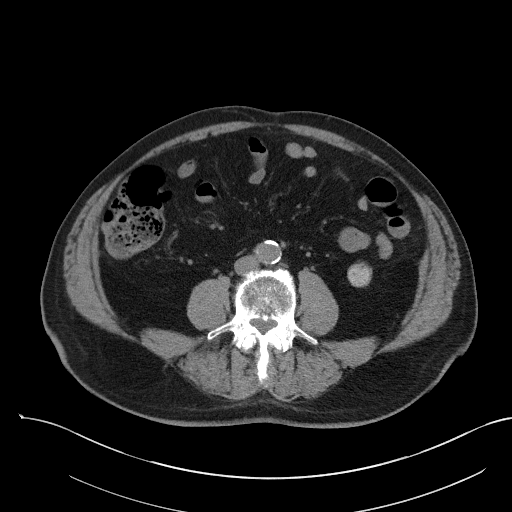
[im 59/99  soft-tissue]
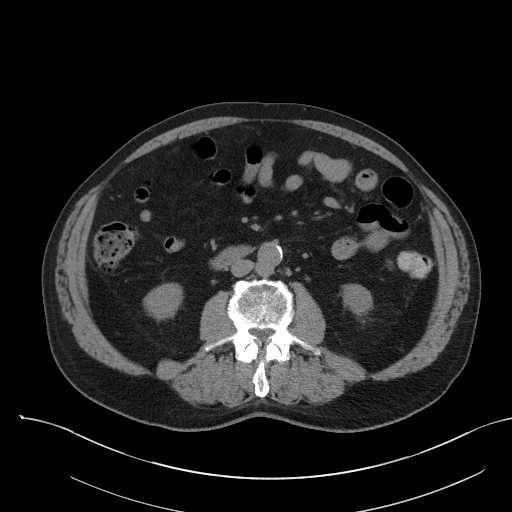
[im 67/99  soft-tissue]
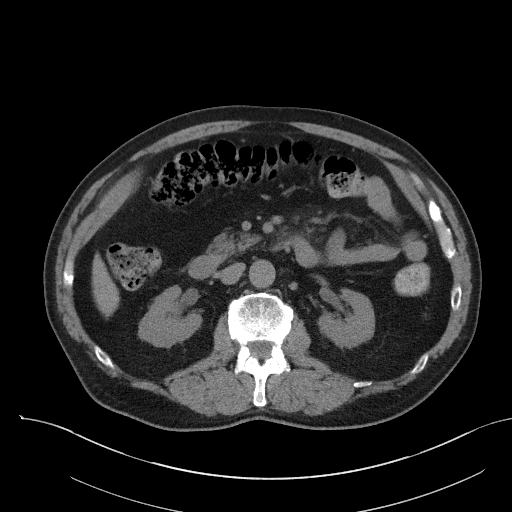
[im 75/99  soft-tissue]
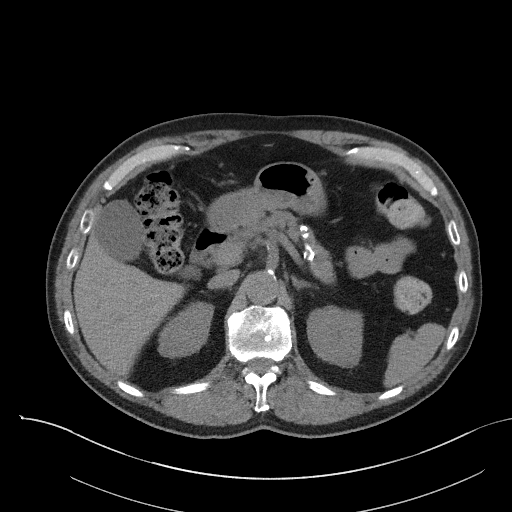
[im 75/99  bone]
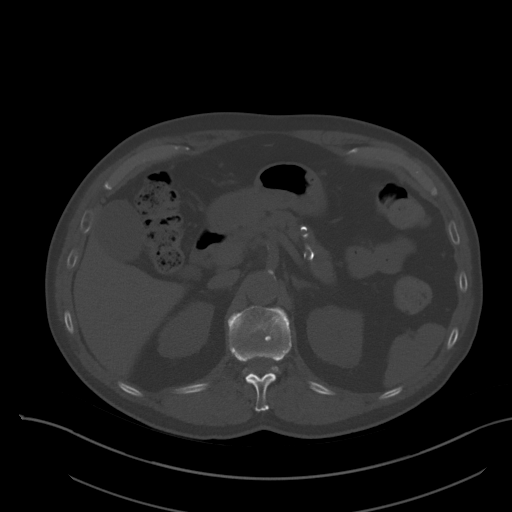
[im 83/99  soft-tissue]
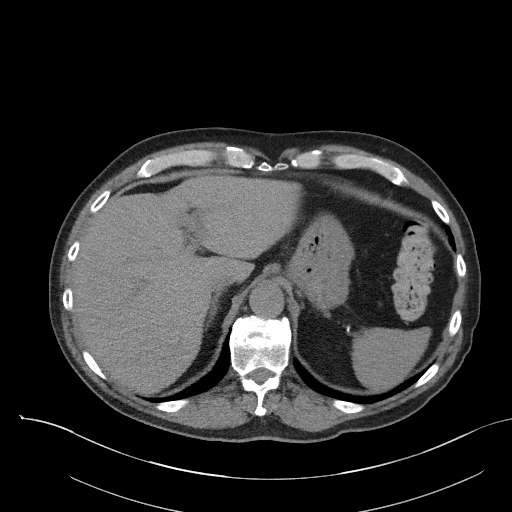
[im 91/99  soft-tissue]
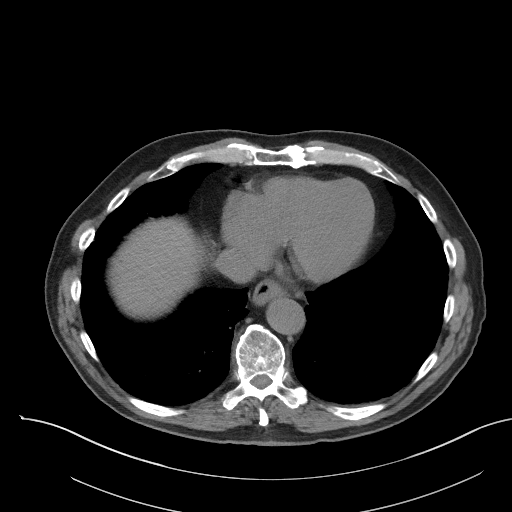

[Series 5: coronal st · coronal · 0.77mm/px · 3 of 94 slices shown]
[im 32/94  soft-tissue]
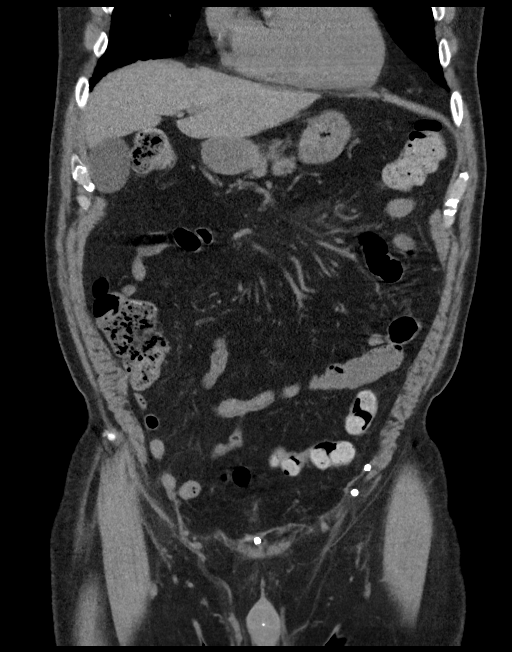
[im 42/94  soft-tissue]
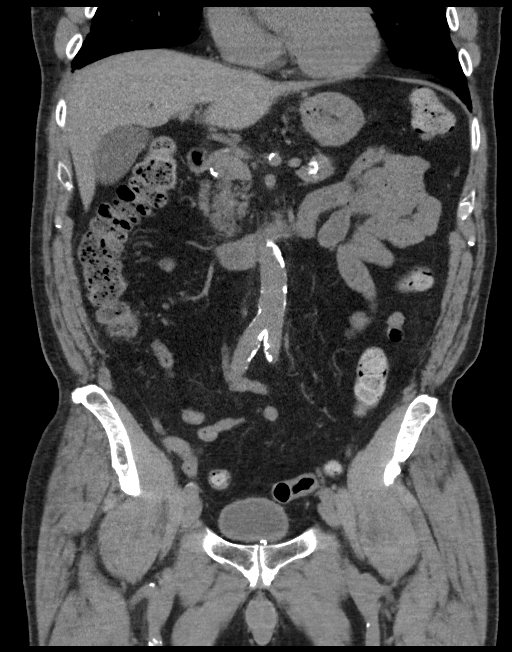
[im 52/94  soft-tissue]
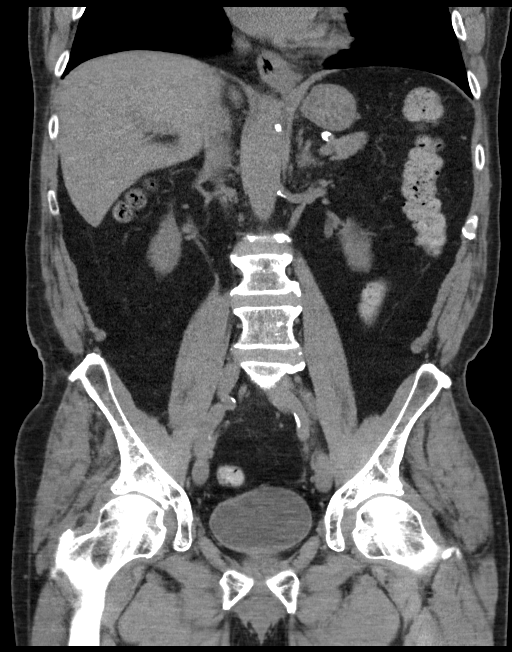

[14 of 46 positions shown; findings below may reference images not displayed]

FINDINGS: Lower chest: Subpleural reticulations in the lung bases could
reflect developing interstitial fibrotic changes. No consolidative
opacity or pleural effusion. Normal heart size. No pericardial
effusion. Coronary artery calcifications are present. Mild bilateral
gynecomastia.

Hepatobiliary: Normal hepatic attenuation. Few subcentimeter
hypoattenuating foci in the liver ([DATE]) too small to fully
characterize on CT imaging but statistically likely benign. Punctate
calcification in the caudate, could reflect a calcified granuloma.
Smooth surface contour. Normal gallbladder without pericholecystic
fluid or inflammation. Normal caliber biliary tree. No visible
calcified gallstones.

Pancreas: Partial fatty replacement of the pancreas. No focal
peripancreatic inflammation or ductal dilatation.

Spleen: Normal in size without focal abnormality.

Adrenals/Urinary Tract: Normal adrenal glands. Kidneys are uniform
in size and normally located. Mild bilateral symmetric perinephric
stranding, a nonspecific finding. No visible or contour deforming
renal lesions. No urolithiasis or frank hydronephrosis. No gross
bladder abnormality.

Stomach/Bowel: Distal esophagus, stomach and duodenum are
unremarkable. No small bowel thickening or dilatation. A normal
appendix is visualized. No colonic dilatation or wall thickening.
Moderate colonic stool burden. Small rectal stool ball measuring up
to 6.4 cm in diameter without perirectal inflammation. Scattered
colonic diverticula without focal inflammation to suggest
diverticulitis.

Vascular/Lymphatic: Atherosclerotic calcifications throughout the
abdominal aorta and branch vessels. No aneurysm or ectasia.
Retroaortic left renal vein no other major venous abnormality is
evident. Focal region of mid to upper mesenteric hazy stranding with
numerous reactive appearing clustered mid mesenteric lymph nodes
compatible with mesenteritis (2/32). No pathologically enlarged
lymph nodes in the abdomen or pelvis.

Reproductive: The prostate and seminal vesicles are unremarkable.

Other: Postsurgical changes from prior mesh hernia repair with
anchors seen in the low anterior pelvis. Small fat containing
umbilical hernia. No bowel containing hernias. No abdominopelvic
free air or fluid.

Musculoskeletal: The osseous structures appear diffusely
demineralized which may limit detection of small or nondisplaced
fractures. Multilevel degenerative changes are present in the imaged
portions of the spine. Partial ankylosis across the SI joints with
additional degenerative changes in the hips and throughout the
pelvis. No acute osseous abnormality or suspicious osseous lesion.
IMPRESSION: 1. Focal region of mid to upper mesenteric hazy stranding with
numerous reactive appearing clustered mid mesenteric lymph nodes
compatible with mesenteritis.
2. Moderate colonic stool burden with a small rectal stool ball
measuring up to 6.4 cm in diameter without perirectal inflammation.
Correlate for features of constipation/slowed intestinal transit.
3. Few scattered colonic diverticula without evidence of acute
diverticulitis. Colonic diverticula can provide a source of lower GI
bleeding in the absence of inflammation.
4. No other acute abdominopelvic abnormality.
5. Subpleural reticulations in the lung bases could reflect
developing interstitial fibrotic changes. Could be better
characterized with nonemergent outpatient HRCT if clinically
warranted.
6. Aortic Atherosclerosis ([id number]).

## 2021-10-01 DIAGNOSIS — H40013 Open angle with borderline findings, low risk, bilateral: Secondary | ICD-10-CM | POA: Diagnosis not present

## 2021-10-17 ENCOUNTER — Emergency Department (HOSPITAL_BASED_OUTPATIENT_CLINIC_OR_DEPARTMENT_OTHER)
Admission: EM | Admit: 2021-10-17 | Discharge: 2021-10-17 | Disposition: A | Discharge status: Home / Self Care | Payer: Medicare Other | Attending: Emergency Medicine | Admitting: Emergency Medicine

## 2021-10-17 ENCOUNTER — Other Ambulatory Visit: Payer: Self-pay

## 2021-10-17 ENCOUNTER — Encounter (HOSPITAL_BASED_OUTPATIENT_CLINIC_OR_DEPARTMENT_OTHER): Payer: Self-pay | Admitting: Pediatrics

## 2021-10-17 DIAGNOSIS — B029 Zoster without complications: Secondary | ICD-10-CM | POA: Diagnosis not present

## 2021-10-17 DIAGNOSIS — R21 Rash and other nonspecific skin eruption: Secondary | ICD-10-CM | POA: Diagnosis not present

## 2021-10-17 NOTE — Discharge Instructions (Signed)
As we discussed rash suggestive of shingles but not classic.  But I would make the assumption it could be prickly since you have had both vaccine series.  I would stay away from folks that are pregnant young children that would have been vaccinated for chickenpox yet and also in particular your neighbor that has undergoing chemotherapy.  Make an appointment follow-up with your doctor.  Since this has been present for about a week.  And not classic do not feel that we need to go with antivirals at this time.  Certainly it is not worsening. ?

## 2021-10-17 NOTE — ED Provider Notes (Signed)
?[facility] ?Provider Note ? ? ?CSN: [id number] ?Arrival date & time: 10/17/21  1337 ? ?  ? ?History ? ?Chief Complaint  ?Patient presents with  ? Rash  ? ? ?David Stanton is a 87 y.o. male. ? ?Patient states that he noticed a rash on the right side of his scalp posteriorly about a week ago.  And shortly thereafter developed some at the mid neck area.  No real pain preceding it.  Not really itchy.  No rash anywhere else.  He has had both of the shingles vaccines.  His wife had had shingles in the past and it reminds him of that.  So he was concerned about that.  Patient states the rash has been present for about a week.  It is not getting worse.  Not involving the face.  No such rash on the left side. ? ? ?  ? ?Home Medications ?Prior to Admission medications   ?Medication Sig Start Date End Date Taking? Authorizing Provider  ?acetaminophen (TYLENOL) 500 MG tablet Take 500 mg by mouth every 6 (six) hours as needed.    [provider]  ?cetirizine (ZYRTEC) 10 MG tablet Take 1 tablet by mouth daily as needed. 07/10/20   [provider]  ?clobetasol cream (TEMOVATE) 2.99 % Apply 1 application topically 2 (two) times daily. 12/26/19   [provider]  ?clopidogrel (PLAVIX) 75 MG tablet Take 1 tablet by mouth daily. 07/10/20   [provider]  ?diclofenac Sodium (VOLTAREN) 1 % GEL  06/17/19   [provider]  ?dutasteride (AVODART) 0.5 MG capsule Take 0.5 mg by mouth daily.    [provider]  ?fenofibrate (TRICOR) 48 MG tablet Take 1 tablet by mouth daily. 07/10/20   [provider]  ?fluorouracil (EFUDEX) 5 % cream APPLY TO THE AFFECTED AREAS NIGHTLY FOR 14 DAYS 08/20/19   [provider]  ?Multiple Vitamins-Minerals (PRESERVISION AREDS 2) CAPS See admin instructions.    [provider]  ?pantoprazole (PROTONIX) 40 MG tablet Take 1 tablet by mouth every 12 (twelve) hours. 07/10/20   [provider]   ?pravastatin (PRAVACHOL) 40 MG tablet Take 1 tablet by mouth at bedtime. 07/10/20   [provider]  ?   ? ?Allergies    ?Omeprazole and Tamsulosin   ? ?Review of Systems   ?Review of Systems  ?Constitutional:  Negative for chills and fever.  ?HENT:  Negative for rhinorrhea and sore throat.   ?Eyes:  Negative for visual disturbance.  ?Respiratory:  Negative for cough and shortness of breath.   ?Cardiovascular:  Negative for chest pain and leg swelling.  ?Gastrointestinal:  Negative for abdominal pain, diarrhea, nausea and vomiting.  ?Genitourinary:  Negative for dysuria.  ?Musculoskeletal:  Negative for back pain and neck pain.  ?Skin:  Positive for rash.  ?Neurological:  Negative for dizziness, light-headedness and headaches.  ?Hematological:  Does not bruise/bleed easily.  ?Psychiatric/Behavioral:  Negative for confusion.   ? ?Physical Exam ?Updated Vital Signs ?BP 120/79 (BP Location: Right Arm)   Pulse 62   Temp 97.8 ?F (36.6 ?C)   Resp 16   Ht 1.829 m (6')   Wt 85.3 kg   SpO2 99%   BMI 25.50 kg/m?  ?Physical Exam ?Vitals and nursing note reviewed.  ?Constitutional:   ?   General: He is not in acute distress. ?   Appearance: Normal appearance. He is well-developed.  ?HENT:  ?   Head: Normocephalic and atraumatic.  ?  Mouth/Throat:  ?   Pharynx: Oropharynx is clear.  ?Eyes:  ?   Extraocular Movements: Extraocular movements intact.  ?   Conjunctiva/sclera: Conjunctivae normal.  ?   Pupils: Pupils are equal, round, and reactive to light.  ?Cardiovascular:  ?   Rate and Rhythm: Normal rate and regular rhythm.  ?   Heart sounds: No murmur heard. ?Pulmonary:  ?   Effort: Pulmonary effort is normal. No respiratory distress.  ?   Breath sounds: Normal breath sounds.  ?Abdominal:  ?   Palpations: Abdomen is soft.  ?   Tenderness: There is no abdominal tenderness.  ?Musculoskeletal:     ?   General: No swelling.  ?   Cervical back: Normal range of motion and neck supple.  ?Skin: ?   General: Skin is warm  and dry.  ?   Capillary Refill: Capillary refill takes less than 2 seconds.  ?   Findings: Rash present.  ?   Comments: Some erythema in the right occipital scalp area.  Collection of papules at the base of the neck on the right side and a little bit posteriorly.  No true vesicles.  No open lesions.  ?Neurological:  ?   General: No focal deficit present.  ?   Mental Status: He is alert and oriented to person, place, and time.  ?Psychiatric:     ?   Mood and Affect: Mood normal.  ? ? ?ED Results / Procedures / Treatments   ?Labs ?(all labs ordered are listed, but only abnormal results are displayed) ?Labs Reviewed - No data to display ? ?EKG ?None ? ?Radiology ?No results found. ? ?Procedures ?Procedures  ? ? ?Medications Ordered in ED ?Medications - No data to display ? ?ED Course/ Medical Decision Making/ A&P ?  ?                        ?Medical Decision Making ? ?Patient's symptoms suggestive of herpes zoster.  But not confirmatory.  Plus the rash has been present now for about a week.  Clinically having a milder outbreak due to having both of the vaccine series.  Do not feel that we need to put him on antivirals at this time.  Certainly patient in no distress and the rash is not very extensive.  We will have him follow-up with primary care provider for recheck later this week. ? ? ?Final Clinical Impression(s) / ED Diagnoses ?Final diagnoses:  ?Herpes zoster without complication  ? ? ?Rx / DC Orders ?ED Discharge Orders   ? ? None  ? ?  ? ? ?  ?Fredia Sorrow, MD ?10/17/21 1631 ? ?

## 2021-10-17 NOTE — ED Triage Notes (Signed)
C/O tender rash on right side of neck and behind the head. Concern for shingles.  ?

## 2021-11-18 ENCOUNTER — Ambulatory Visit (INDEPENDENT_AMBULATORY_CARE_PROVIDER_SITE_OTHER): Payer: Medicare Other | Admitting: Podiatry

## 2021-11-18 ENCOUNTER — Encounter: Payer: Self-pay | Admitting: Podiatry

## 2021-11-18 DIAGNOSIS — M79672 Pain in left foot: Secondary | ICD-10-CM | POA: Diagnosis not present

## 2021-11-18 DIAGNOSIS — M79675 Pain in left toe(s): Secondary | ICD-10-CM | POA: Diagnosis not present

## 2021-11-18 DIAGNOSIS — B351 Tinea unguium: Secondary | ICD-10-CM

## 2021-11-18 DIAGNOSIS — L600 Ingrowing nail: Secondary | ICD-10-CM

## 2021-11-18 DIAGNOSIS — M79674 Pain in right toe(s): Secondary | ICD-10-CM | POA: Diagnosis not present

## 2021-11-18 DIAGNOSIS — Q828 Other specified congenital malformations of skin: Secondary | ICD-10-CM

## 2021-11-28 NOTE — Progress Notes (Signed)
5  ?Subjective:  ?Patient ID: David Stanton, male    DOB: [birth year],  MRN: [id number] ? ?[name] presents to clinic today for painful porokeratotic lesion(s) left lower extremity and painful mycotic toenails that limit ambulation. Painful toenails interfere with ambulation. Aggravating factors include wearing enclosed shoe gear. Pain is relieved with periodic professional debridement. Painful porokeratotic lesions are aggravated when weightbearing with and without shoegear. Pain is relieved with periodic professional debridement. ? ?New problem(s): None.  ? ?PCP is Avva, Ravisankar, MD , and last visit was January, 2023. ? ?Allergies  ?Allergen Reactions  ? Omeprazole   ?  Other reaction(s): The addivities that are in the medicine  ? Tamsulosin   ?  Other reaction(s): Vertigo ?Other reaction(s): Vertigo  ? ? ?Review of Systems: Negative except as noted in the HPI. ? ?Objective: No changes noted in today's physical examination. ? ?General: David Stanton is a pleasant 87 y.o. male, WD, WN in NAD. AAO x 3.  ? ?Vascular:  ?CFT <3 seconds b/l LE. Palpable DP/PT pulses b/l LE. Digital hair present b/l. Skin temperature gradient WNL b/l. No pain with calf compression b/l. No edema noted b/l. No cyanosis or clubbing noted b/l LE.  ? ?Dermatological:  ?Pedal integument with normal turgor, texture and tone b/l LE. No open wounds b/l. No interdigital macerations b/l. Toenails 1-5 b/l elongated, thickened, discolored with subungual debris. +Tenderness with dorsal palpation of nailplates. Porokeratotic lesion(s) noted submet head 5 left foot. Incurvated nailplate medial border(s) L hallux and lateral border R hallux.  Nail border hypertrophy minimal. There is tenderness to palpation. Sign(s) of infection: no clinical signs of infection noted on examination today. ? ?Musculoskeletal:  ?Normal muscle strength 5/5 to all lower extremity muscle groups bilaterally. No pain, crepitus or joint limitation noted with ROM b/l  LE. No gross bony pedal deformities b/l. Patient ambulates independently without assistive aids.  ? ?Neurological:  ?Protective sensation intact 5/5 intact bilaterally with 10g monofilament b/l. Vibratory sensation intact b/l. Proprioception intact bilaterally.  ? ?Assessment/Plan: ?1. Pain due to onychomycosis of toenails of both feet   ?2. Porokeratosis   ?3. Ingrown toenail without infection   ?-Patient was evaluated and treated. All patient's and/or POA's questions/concerns answered on today's visit. ?-Medicare ABN signed. Patient consents for services of paring of corn(s)/callus(es)/porokeratos(es) today. Copy in patient chart. ?-Patient to continue soft, supportive shoe gear daily. ?-Toenails 1-5 b/l were debrided in length and girth with sterile nail nippers and dremel without iatrogenic bleeding.  ?-Offending nail border debrided and curretaged bilateral great toes utilizing sterile nail nipper and currette. Border cleansed with alcohol and triple antibiotic applied. No further treatment required by patient/caregiver. ?-Painful porokeratotic lesion(s) submet head 5 left foot pared and enucleated with sterile scalpel blade without incident. Total number of lesions debrided=1. ?-Patient/POA to call should there be question/concern in the interim.  ? ?Return in about 3 months (around 02/17/2022). ? ?Marzetta Board, DPM  ?

## 2022-02-19 ENCOUNTER — Ambulatory Visit (INDEPENDENT_AMBULATORY_CARE_PROVIDER_SITE_OTHER): Payer: Medicare Other | Admitting: Podiatry

## 2022-02-19 ENCOUNTER — Encounter: Payer: Self-pay | Admitting: Podiatry

## 2022-02-19 DIAGNOSIS — Q828 Other specified congenital malformations of skin: Secondary | ICD-10-CM

## 2022-02-19 DIAGNOSIS — M79674 Pain in right toe(s): Secondary | ICD-10-CM

## 2022-02-19 DIAGNOSIS — L600 Ingrowing nail: Secondary | ICD-10-CM

## 2022-02-19 DIAGNOSIS — M79675 Pain in left toe(s): Secondary | ICD-10-CM

## 2022-02-19 DIAGNOSIS — B351 Tinea unguium: Secondary | ICD-10-CM

## 2022-02-28 NOTE — Progress Notes (Signed)
  Subjective:  Patient ID: David Stanton, male    DOB: Dec 30, 1934,  MRN: [id number]  David Stanton presents to clinic today for painful porokeratotic lesion(s) left lower extremity and painful mycotic toenails that limit ambulation. Painful toenails interfere with ambulation. Aggravating factors include wearing enclosed shoe gear. Pain is relieved with periodic professional debridement. Painful porokeratotic lesions are aggravated when weightbearing with and without shoegear. Pain is relieved with periodic professional debridement.  New problem(s): None.   PCP is Avva, Steva Ready, MD , and last visit was January, 2023.  Allergies  Allergen Reactions   Omeprazole     Other reaction(s): The addivities that are in the medicine   Tamsulosin     Other reaction(s): Vertigo Other reaction(s): Vertigo    Review of Systems: Negative except as noted in the HPI.  Objective: No changes noted in today's physical examination.  General: David Stanton is a pleasant 87 y.o. male, WD, WN in NAD. AAO x 3.   Vascular:  CFT <3 seconds b/l LE. Palpable DP/PT pulses b/l LE. Digital hair present b/l. Skin temperature gradient WNL b/l. No pain with calf compression b/l. No edema noted b/l. No cyanosis or clubbing noted b/l LE.   Dermatological:  Pedal integument with normal turgor, texture and tone b/l LE. No open wounds b/l. No interdigital macerations b/l. Toenails 1-5 b/l elongated, thickened, discolored with subungual debris. +Tenderness with dorsal palpation of nailplates. Porokeratotic lesion(s) noted submet head 5 left foot. Incurvated nailplate laterall border(s) L hallux and lateral border R hallux.  Nail border hypertrophy minimal. There is tenderness to palpation. Sign(s) of infection: no clinical signs of infection noted on examination today.  Musculoskeletal:  Normal muscle strength 5/5 to all lower extremity muscle groups bilaterally. No pain, crepitus or joint limitation noted with ROM b/l  LE. No gross bony pedal deformities b/l. Patient ambulates independently without assistive aids.   Neurological:  Protective sensation intact 5/5 intact bilaterally with 10g monofilament b/l. Vibratory sensation intact b/l. Proprioception intact bilaterally.  Assessment/Plan: No diagnosis found.   -Patient was evaluated and treated. All patient's and/or POA's questions/concerns answered on today's visit. -Medicare ABN signed for services of paring of corn(s)/callus(es)/porokeratos(es). Copy in patient chart. -Toenails 1-5 b/l were debrided in length and girth with sterile nail nippers and dremel without iatrogenic bleeding.  -Discussed chronicity of ingrown toenail(s) of bilateral great toes. Recommended patient consider having matrixectomy performed to alleviate chronic ingrown toenail(s). Discussed in-office procedure and post-procedure instructions. Patient agrees and will be referred to Dr. Lanae Crumbly for procedure. -Offending nail border debrided and curretaged bilateral great toes utilizing sterile nail nipper and currette. Border cleansed with alcohol and triple antibiotic applied. No further treatment required by patient/caregiver. Call office if there are any concerns. -Porokeratotic lesion(s) submet head 5 left foot pared and enucleated with sterile scalpel blade without incident. Total number of lesions debrided=1. -Patient/POA to call should there be question/concern in the interim.   Return in about 10 weeks (around 04/30/2022).  Marzetta Board, DPM

## 2022-03-04 DIAGNOSIS — Z125 Encounter for screening for malignant neoplasm of prostate: Secondary | ICD-10-CM | POA: Diagnosis not present

## 2022-03-04 DIAGNOSIS — E785 Hyperlipidemia, unspecified: Secondary | ICD-10-CM | POA: Diagnosis not present

## 2022-03-04 DIAGNOSIS — R7989 Other specified abnormal findings of blood chemistry: Secondary | ICD-10-CM | POA: Diagnosis not present

## 2022-03-04 DIAGNOSIS — N1832 Chronic kidney disease, stage 3b: Secondary | ICD-10-CM | POA: Diagnosis not present

## 2022-03-10 DIAGNOSIS — H348192 Central retinal vein occlusion, unspecified eye, stable: Secondary | ICD-10-CM | POA: Diagnosis not present

## 2022-03-10 DIAGNOSIS — R7301 Impaired fasting glucose: Secondary | ICD-10-CM | POA: Diagnosis not present

## 2022-03-10 DIAGNOSIS — G459 Transient cerebral ischemic attack, unspecified: Secondary | ICD-10-CM | POA: Diagnosis not present

## 2022-03-10 DIAGNOSIS — N1832 Chronic kidney disease, stage 3b: Secondary | ICD-10-CM | POA: Diagnosis not present

## 2022-03-10 DIAGNOSIS — R279 Unspecified lack of coordination: Secondary | ICD-10-CM | POA: Diagnosis not present

## 2022-03-10 DIAGNOSIS — Z23 Encounter for immunization: Secondary | ICD-10-CM | POA: Diagnosis not present

## 2022-03-10 DIAGNOSIS — Z0000 Encounter for general adult medical examination without abnormal findings: Secondary | ICD-10-CM | POA: Diagnosis not present

## 2022-03-10 DIAGNOSIS — N400 Enlarged prostate without lower urinary tract symptoms: Secondary | ICD-10-CM | POA: Diagnosis not present

## 2022-03-10 DIAGNOSIS — E785 Hyperlipidemia, unspecified: Secondary | ICD-10-CM | POA: Diagnosis not present

## 2022-03-10 DIAGNOSIS — R82998 Other abnormal findings in urine: Secondary | ICD-10-CM | POA: Diagnosis not present

## 2022-03-10 DIAGNOSIS — M1990 Unspecified osteoarthritis, unspecified site: Secondary | ICD-10-CM | POA: Diagnosis not present

## 2022-03-11 DIAGNOSIS — H40013 Open angle with borderline findings, low risk, bilateral: Secondary | ICD-10-CM | POA: Diagnosis not present

## 2022-03-11 DIAGNOSIS — H348112 Central retinal vein occlusion, right eye, stable: Secondary | ICD-10-CM | POA: Diagnosis not present

## 2022-03-11 DIAGNOSIS — H2513 Age-related nuclear cataract, bilateral: Secondary | ICD-10-CM | POA: Diagnosis not present

## 2022-03-11 DIAGNOSIS — H353122 Nonexudative age-related macular degeneration, left eye, intermediate dry stage: Secondary | ICD-10-CM | POA: Diagnosis not present

## 2022-05-04 ENCOUNTER — Ambulatory Visit (INDEPENDENT_AMBULATORY_CARE_PROVIDER_SITE_OTHER): Payer: Medicare Other | Admitting: Podiatry

## 2022-05-04 DIAGNOSIS — M79674 Pain in right toe(s): Secondary | ICD-10-CM | POA: Diagnosis not present

## 2022-05-04 DIAGNOSIS — Q828 Other specified congenital malformations of skin: Secondary | ICD-10-CM

## 2022-05-04 DIAGNOSIS — B351 Tinea unguium: Secondary | ICD-10-CM | POA: Diagnosis not present

## 2022-05-04 DIAGNOSIS — L600 Ingrowing nail: Secondary | ICD-10-CM | POA: Diagnosis not present

## 2022-05-04 DIAGNOSIS — M79675 Pain in left toe(s): Secondary | ICD-10-CM | POA: Diagnosis not present

## 2022-05-04 MED ORDER — NEOMYCIN-POLYMYXIN-HC 1 % OT SOLN: OTIC | 0 refills | Status: DC

## 2022-05-04 NOTE — Patient Instructions (Signed)

## 2022-05-08 NOTE — Progress Notes (Signed)
  Subjective:  Patient ID: David Stanton, male    DOB: [birth year],  MRN: [id number]  Chief Complaint  Patient presents with   Ingrown Toenail    Both big toes are developing pincer nails - Dr. Elisha Ponder has dug out the corners in the past   Nail Problem    9 week follow up, thick painful toenails   Callouses    Bilateral 5th submet - left more painful than right    87 y.o. male presents with the above complaint. History confirmed with patient.   Objective:  Physical Exam: warm, good capillary refill, no trophic changes or ulcerative lesions, normal DP and PT pulses, and normal sensory exam. Left Foot: dystrophic yellowed discolored nail plates with subungual debris and ingrown pincer nail deformity of great toe, submetatarsal 5 callus Right Foot: dystrophic yellowed discolored nail plates with subungual debris and ingrown pincer nail deformity of great toe   Assessment:   1. Pain due to onychomycosis of toenails of both feet   2. Ingrown toenail without infection   3. Porokeratosis      Plan:  Patient was evaluated and treated and all questions answered.   Discussed the etiology and treatment options for the condition in detail with the patient. Educated patient on the topical and oral treatment options for mycotic nails. Recommended debridement of the nails today. Sharp and mechanical debridement performed of all painful and mycotic nails today. Nails debrided in length and thickness using a nail nipper to level of comfort. Discussed treatment options including appropriate shoe gear. Follow up as needed for painful nails.  All symptomatic hyperkeratoses were safely debrided with a sterile #15 blade to patient's level of comfort without incident. We discussed preventative and palliative care of these lesions including supportive and accommodative shoegear, padding, prefabricated and custom molded accommodative orthoses, use of a pumice stone and lotions/creams daily.    Ingrown  Nail, bilaterally -Patient elects to proceed with minor surgery to remove ingrown toenail today. Consent reviewed and signed by patient. -Ingrown nail excised. See procedure note. -Educated on post-procedure care including soaking. Written instructions provided and reviewed. -Patient to follow up in 3 weeks for nail check.  Procedure: Excision of Ingrown Toenail Location: Bilateral 1st toe  medial and lateral  nail borders. Anesthesia: Lidocaine 1% plain; 1.5 mL and Marcaine 0.5% plain; 1.5 mL, digital block. Skin Prep: Betadine. Dressing: Silvadene; telfa; dry, sterile, compression dressing. Technique: Following skin prep, the toe was exsanguinated and a tourniquet was secured at the base of the toe. The affected nail border was freed, split with a nail splitter, and excised. Chemical matrixectomy was then performed with phenol and irrigated out with alcohol. The tourniquet was then removed and sterile dressing applied. Disposition: Patient tolerated procedure well. Patient to return in 3 weeks for follow-up.    Return in about 3 weeks (around 05/25/2022) for nail re-check.

## 2022-05-27 ENCOUNTER — Ambulatory Visit (INDEPENDENT_AMBULATORY_CARE_PROVIDER_SITE_OTHER): Payer: Medicare Other | Admitting: Podiatry

## 2022-05-27 DIAGNOSIS — L600 Ingrowing nail: Secondary | ICD-10-CM

## 2022-05-27 NOTE — Progress Notes (Signed)
  Subjective:  Patient ID: David Stanton, male    DOB: Dec 03, 1934,  MRN: [id number]  Chief Complaint  Patient presents with   Ingrown Toenail     3 weeks for painful ingrown toenails bilateral great toes    87 y.o. male presents with the above complaint. History confirmed with patient.   Objective:  Physical Exam: warm, good capillary refill, no trophic changes or ulcerative lesions, normal DP and PT pulses, and normal sensory exam.  Bilateral great toe matricectomy sites are healing well    Assessment:   1. Ingrown toenail without infection      Plan:  Patient was evaluated and treated and all questions answered.    Ingrown Nail, bilaterally -Overall doing much better.  May leave open to air and discontinue soaks and ointments.   Return if symptoms worsen or fail to improve.

## 2022-05-28 ENCOUNTER — Ambulatory Visit: Payer: Medicare Other | Admitting: Podiatry

## 2022-06-16 DIAGNOSIS — R5383 Other fatigue: Secondary | ICD-10-CM | POA: Diagnosis not present

## 2022-06-16 DIAGNOSIS — R051 Acute cough: Secondary | ICD-10-CM | POA: Diagnosis not present

## 2022-06-16 DIAGNOSIS — J302 Other seasonal allergic rhinitis: Secondary | ICD-10-CM | POA: Diagnosis not present

## 2022-06-16 DIAGNOSIS — Z1152 Encounter for screening for COVID-19: Secondary | ICD-10-CM | POA: Diagnosis not present

## 2022-06-16 DIAGNOSIS — J069 Acute upper respiratory infection, unspecified: Secondary | ICD-10-CM | POA: Diagnosis not present

## 2022-06-16 DIAGNOSIS — J029 Acute pharyngitis, unspecified: Secondary | ICD-10-CM | POA: Diagnosis not present

## 2022-06-16 DIAGNOSIS — R0981 Nasal congestion: Secondary | ICD-10-CM | POA: Diagnosis not present

## 2022-08-20 ENCOUNTER — Encounter: Payer: Self-pay | Admitting: Cardiology

## 2022-08-20 ENCOUNTER — Ambulatory Visit: Payer: Medicare Other | Admitting: Cardiology

## 2022-08-20 VITALS — BP 140/81 | HR 70 | Resp 16 | Ht 72.0 in | Wt 196.8 lb

## 2022-08-20 DIAGNOSIS — G453 Amaurosis fugax: Secondary | ICD-10-CM

## 2022-08-20 DIAGNOSIS — E7800 Pure hypercholesterolemia, unspecified: Secondary | ICD-10-CM

## 2022-08-20 DIAGNOSIS — I6523 Occlusion and stenosis of bilateral carotid arteries: Secondary | ICD-10-CM | POA: Diagnosis not present

## 2022-08-20 DIAGNOSIS — G459 Transient cerebral ischemic attack, unspecified: Secondary | ICD-10-CM

## 2022-08-20 DIAGNOSIS — I440 Atrioventricular block, first degree: Secondary | ICD-10-CM | POA: Diagnosis not present

## 2022-08-20 NOTE — Progress Notes (Signed)
Primary Physician/Referring:  Prince Solian, MD  Patient ID: David Stanton, male    DOB: June 12, 1935, 88 y.o.   MRN: [id number]  Chief Complaint  Patient presents with   Bradycardia   Follow-up    1 year   HPI:    David Stanton  is a 88 y.o.  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 He also has asymptomatic profound bradycardia for many years with HR 35 to 40/min.    He remains asymptomatic and continues to be active.    Past Medical History:  Diagnosis Date   High cholesterol    Retinal hemorrhage    Past Surgical History:  Procedure Laterality Date   EYE SURGERY     HERNIA REPAIR     KNEE SURGERY     Family History  Problem Relation Age of Onset   Stroke Mother    Cancer Father    Cancer Son    Stomach cancer Son     Social History   Tobacco Use   Smoking status: Former    Packs/day: 1.00    Years: 19.00    Total pack years: 19.00    Types: Cigarettes    Quit date: 1980    Years since quitting: 44.0   Smokeless tobacco: Never  Substance Use Topics   Alcohol use: Yes    Comment: occ   Marital Status: Married   ROS  Review of Systems  Cardiovascular:  Negative for chest pain, dyspnea on exertion and leg swelling.  Gastrointestinal:  Negative for melena.   Objective  Blood pressure (!) 140/81, pulse 70, resp. rate 16, height 6' (1.829 m), weight 196 lb 12.8 oz (89.3 kg), SpO2 93 %.     08/20/2022    2:30 PM 10/17/2021    4:33 PM 10/17/2021    1:59 PM  Vitals with BMI  Height '6\' 0"'$     Weight 196 lbs 13 oz    BMI 53.29    Systolic 924 268 341  Diastolic 81 78 79  Pulse 70 65 62     Physical Exam Neck:     Vascular: No carotid bruit or JVD.  Cardiovascular:     Rate and Rhythm: Normal rate and regular rhythm.      Pulses: Intact distal pulses.     Heart sounds: Normal heart sounds. No murmur heard.    No gallop.  Pulmonary:     Effort: Pulmonary effort is normal.     Breath sounds: Normal breath sounds.  Abdominal:     General: Bowel sounds are normal.     Palpations: Abdomen is soft.  Musculoskeletal:     Right lower leg: No edema.     Left lower leg: No edema.    Laboratory examination:    External labs:   Labs 03/05/2022:  Serum glucose 122 mg, BUN 24, creatinine 1.7, EGFR 38/46 mL, potassium 4.3.  Hb 13.4/HCT 38.3, platelets 186.  Normal indicis.  Total cholesterol 143, triglycerides 161, HDL 51, LDL 60.  Non-HDL cholesterol 92.  TSH normal at 2.83.  A1c 5.7%.  Labs 08/11/2021:  Serum glucose 120 mg, BUN 17, creatinine 1.4, EGFR 48 mL, potassium 4.5.  CMP normal otherwise normal.  Hb 13.2/HCT 38.9, platelets 181.  Normal indicis.   Medications and allergies   Allergies  Allergen Reactions   Omeprazole     Other reaction(s): The addivities that are in the medicine   Tamsulosin     Other reaction(s): Vertigo Other reaction(s): Vertigo     Current Outpatient Medications:    acetaminophen (TYLENOL) 500 MG tablet, Take 500 mg by mouth every 6 (six) hours as needed., Disp: , Rfl:    cetirizine (ZYRTEC) 10 MG tablet, Take 1 tablet by mouth daily as needed., Disp: , Rfl:    clobetasol cream (TEMOVATE) 0.93 %, Apply 1 application topically 2 (two) times daily., Disp: , Rfl:    clopidogrel (PLAVIX) 75 MG tablet, Take 1 tablet by mouth daily., Disp: , Rfl:    diclofenac Sodium (VOLTAREN) 1 % GEL, , Disp: , Rfl:    dutasteride (AVODART) 0.5 MG capsule, Take 0.5 mg by mouth daily., Disp: , Rfl:    fenofibrate (TRICOR) 48 MG tablet, Take 1 tablet by mouth daily., Disp: , Rfl:    fluorouracil (EFUDEX) 5 % cream, APPLY TO THE AFFECTED AREAS NIGHTLY FOR 14 DAYS, Disp: , Rfl:    Multiple Vitamins-Minerals (PRESERVISION AREDS 2) CAPS, See admin instructions., Disp: , Rfl:     NEOMYCIN-POLYMYXIN-HYDROCORTISONE (CORTISPORIN) 1 % SOLN OTIC solution, Apply to nail beds from procedure site twice daily after soaks, Disp: 10 mL, Rfl: 0   pantoprazole (PROTONIX) 40 MG tablet, Take 1 tablet by mouth every 12 (twelve) hours., Disp: , Rfl:    pravastatin (PRAVACHOL) 40 MG tablet, Take 1 tablet by mouth at bedtime., Disp: , Rfl:    trimethoprim-polymyxin b (POLYTRIM) ophthalmic solution, , Disp: , Rfl:    Radiology:   CT scan of the abdomen 02/01/2020: 1. Focal region of mid to upper mesenteric hazy stranding with numerous reactive appearing clustered mid mesenteric lymph nodes compatible with mesenteritis. 2. Moderate colonic stool burden with a small rectal stool ball measuring up to 6.4 cm in diameter without perirectal inflammation. Correlate for features of constipation/slowed intestinal transit. 3. Few scattered colonic diverticula without evidence of acute diverticulitis. Colonic diverticula can provide a source of lower GI bleeding in the absence of inflammation. 4. No other acute abdominopelvic abnormality. 5. Subpleural reticulations in the lung bases could reflect developing interstitial fibrotic changes. Could be better characterized with nonemergent outpatient HRCT if clinically warranted. 6. Aortic Atherosclerosis   Cardiac Studies:   Stress EKG 04/03/2013: Indications: Bradycardia, Screening for CAD. Conclusions: Normal HR response to exercise. Normal BP. Good exercise tolerence. Continue primary prevention. No indication for pacemaker. Symptoms: THR reached. No symptoms. Arrhythmia: PAC and Occasional non conducted PAC. The patient exercised according to the Bruce protocol, Total time recorded 6 Min. 2 sec. achieving a max heart rate of 172 which was 119% of MPHR for age and 7.3 METS of work. 3 mm ST depression back to baseline immediately into recovery  PCV ECHOCARDIOGRAM COMPLETE 09/09/2020  Narrative Echocardiogram 09/09/2020: Normal LV systolic function  with visual EF 55-60%. Left ventricle cavity is normal in size. Normal global wall motion. Normal diastolic filling pattern, normal LAP. Mild (Grade I) mitral regurgitation. Mild tricuspid regurgitation. No evidence of pulmonary hypertension. Compared to prior study dated 03/22/2013: no significant change except Trace MR and TR are now Mild in severity.    Carotid artery duplex 08/28/2021: Duplex suggests stenosis in the right internal carotid artery (1-15%). Duplex suggests stenosis in the left internal carotid artery (1-15%). Compared to 09/09/2020, no significant change, mild regression of right carotid stenosis (rt ICA 15-49%, lower end of spectrum). Antegrade right vertebral artery flow. Antegrade  left vertebral artery flow.  EKG:   EKG 08/20/2022: Sinus rhythm with first-degree AV block at a rate of 68 bpm, normal axis, incomplete right bundle branch block.  No evidence of ischemia.  Compared to 08/20/2021, first-degree AV block new  Assessment     ICD-10-CM   1. Amaurosis fugax  G45.3 EKG 12-Lead    2. TIA (transient ischemic attack)  G45.9     3. Hypercholesteremia  E78.00     4. Bilateral carotid artery stenosis  I65.23     5. 1st degree AV block  I44.0        There are no discontinued medications.   No orders of the defined types were placed in this encounter.  Orders Placed This Encounter  Procedures   EKG 12-Lead    Recommendations:   David Stanton is a 88 y.o. 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 He also has asymptomatic profound bradycardia for many years with HR 35 to 40/min. He remains asymptomatic despite HR consistently high 30s to low 40s.  1. Amaurosis fugax Patient without recurrence of amaurosis fugax, I reviewed the results  of the carotid artery duplex, significant improvement in overall carotid stenosis, he has only heterogenous fixed plaque, I do not think he needs further evaluation and surveillance of carotid artery duplex.  Continue present management.  2. TIA (transient ischemic attack) Patient states that he is extremely happy that he has not had any further TIA-like symptoms since he has been on aggressive medical therapy with statins and Plavix.  No bleeding diathesis.  I reviewed his external labs, CBC has remained stable and normal.  3. Hypercholesteremia Lipids under excellent control with a combination of pravastatin and fenofibrate.  Continue present management.  4. 1st degree AV Block This is the only new change that I see on his EKG, patient has asymptomatic sinus bradycardia, he now has first-degree AV block.  Upon questioning, patient was evaluated at [facility] for routine visit, states that he reached there early and walked 3 miles without stopping in the [place].  Only speaks about his excellent cardiac tolerance.  Will continue to monitor.  5. Bilateral carotid artery stenosis He will not need further evaluation or carotid artery duplex.  I will see him back in a year or sooner if problems.    Adrian Prows, MD, [facility] 08/20/2022, 3:10 PM Office: [phone]

## 2022-08-23 ENCOUNTER — Encounter: Payer: Self-pay | Admitting: Podiatry

## 2022-08-23 ENCOUNTER — Ambulatory Visit (INDEPENDENT_AMBULATORY_CARE_PROVIDER_SITE_OTHER): Payer: Medicare Other | Admitting: Podiatry

## 2022-08-23 VITALS — BP 125/72

## 2022-08-23 DIAGNOSIS — Q828 Other specified congenital malformations of skin: Secondary | ICD-10-CM | POA: Diagnosis not present

## 2022-08-23 DIAGNOSIS — M79674 Pain in right toe(s): Secondary | ICD-10-CM

## 2022-08-23 DIAGNOSIS — L853 Xerosis cutis: Secondary | ICD-10-CM | POA: Diagnosis not present

## 2022-08-23 DIAGNOSIS — C44229 Squamous cell carcinoma of skin of left ear and external auricular canal: Secondary | ICD-10-CM | POA: Diagnosis not present

## 2022-08-23 DIAGNOSIS — D485 Neoplasm of uncertain behavior of skin: Secondary | ICD-10-CM | POA: Diagnosis not present

## 2022-08-23 DIAGNOSIS — B351 Tinea unguium: Secondary | ICD-10-CM

## 2022-08-23 DIAGNOSIS — M79675 Pain in left toe(s): Secondary | ICD-10-CM

## 2022-08-23 DIAGNOSIS — M79672 Pain in left foot: Secondary | ICD-10-CM

## 2022-08-23 DIAGNOSIS — Z85828 Personal history of other malignant neoplasm of skin: Secondary | ICD-10-CM | POA: Diagnosis not present

## 2022-08-23 DIAGNOSIS — L820 Inflamed seborrheic keratosis: Secondary | ICD-10-CM | POA: Diagnosis not present

## 2022-08-23 DIAGNOSIS — L821 Other seborrheic keratosis: Secondary | ICD-10-CM | POA: Diagnosis not present

## 2022-08-23 NOTE — Progress Notes (Signed)
  Subjective:  Patient ID: David Stanton, male    DOB: July 05, 1935,  MRN: [id number]  David Stanton presents to clinic today for painful porokeratotic lesion(s) left lower extremity and painful mycotic toenails that limit ambulation. Painful toenails interfere with ambulation. Aggravating factors include wearing enclosed shoe gear. Pain is relieved with periodic professional debridement. Painful porokeratotic lesions are aggravated when weightbearing with and without shoegear. Pain is relieved with periodic professional debridement.  Chief Complaint  Patient presents with   Nail Problem    RFC PCP-[facility] PCP VST-6 months ago   New problem(s): None.   Mr. Forst did have ingrown toenails b/l great toes removed by Dr. Sherryle Lis. Both have healed and he is pleased with results. States it looks like the old nails may want to fall off.  PCP is Avva, Ravisankar, MD.  Allergies  Allergen Reactions   Omeprazole     Other reaction(s): The addivities that are in the medicine   Tamsulosin     Other reaction(s): Vertigo Other reaction(s): Vertigo    Review of Systems: Negative except as noted in the HPI.  Objective: No changes noted in today's physical examination. Vitals:   08/23/22 0944  BP: 125/72   David Stanton is a pleasant 88 y.o. male WD, WN in NAD. AAO x 3. Vascular:  CFT <3 seconds b/l LE. Palpable DP/PT pulses b/l LE. Digital hair present b/l. Skin temperature gradient WNL b/l. No pain with calf compression b/l. No edema noted b/l. No cyanosis or clubbing noted b/l LE.   Dermatological:  Pedal integument with normal turgor, texture and tone b/l LE. No open wounds b/l. No interdigital macerations b/l.   Toenails 1-5 b/l elongated, thickened, discolored with subungual debris. +Tenderness with dorsal palpation of nailplates.   Porokeratotic lesion(s) noted submet head 5 left foot.   Evidence of partial matrixectomy bilateral great toes. Procedure site of bilateral great toes  noted to be completely healed with no erythema, no edema, no drainage, no purulence.    Musculoskeletal:  Normal muscle strength 5/5 to all lower extremity muscle groups bilaterally. No pain, crepitus or joint limitation noted with ROM b/l LE. No gross bony pedal deformities b/l. Patient ambulates independently without assistive aids.   Neurological:  Protective sensation intact 5/5 intact bilaterally with 10g monofilament b/l. Vibratory sensation intact b/l. Proprioception intact bilaterally.   Assessment/Plan: 1. Pain due to onychomycosis of toenails of both feet   2. Porokeratosis   3. Left foot pain     No orders of the defined types were placed in this encounter.   None -Consent given for treatment as described below: -Examined patient. -Patient s/p ingrown toenail removal with Dr. Sherryle Lis. Both great toes have healed. -Medicare ABN on file for paring of corn(s)/callus(es)/porokeratos(es). Copy in patient chart. -Continue supportive shoe gear daily. -Toenails 1-5 b/l were debrided in length and girth with sterile nail nippers and dremel without iatrogenic bleeding.  -Porokeratotic lesion(s) submet head 5 left foot pared and enucleated with sterile currette without incident. Total number of lesions debrided=1. -Patient/POA to call should there be question/concern in the interim.   Return in about 3 months (around 11/22/2022).  Marzetta Board, DPM

## 2022-09-07 DIAGNOSIS — C44229 Squamous cell carcinoma of skin of left ear and external auricular canal: Secondary | ICD-10-CM | POA: Diagnosis not present

## 2022-09-07 DIAGNOSIS — Z85828 Personal history of other malignant neoplasm of skin: Secondary | ICD-10-CM | POA: Diagnosis not present

## 2022-09-13 DIAGNOSIS — R7301 Impaired fasting glucose: Secondary | ICD-10-CM | POA: Diagnosis not present

## 2022-09-13 DIAGNOSIS — N1832 Chronic kidney disease, stage 3b: Secondary | ICD-10-CM | POA: Diagnosis not present

## 2022-09-13 DIAGNOSIS — H348192 Central retinal vein occlusion, unspecified eye, stable: Secondary | ICD-10-CM | POA: Diagnosis not present

## 2022-09-13 DIAGNOSIS — R279 Unspecified lack of coordination: Secondary | ICD-10-CM | POA: Diagnosis not present

## 2022-09-13 DIAGNOSIS — N400 Enlarged prostate without lower urinary tract symptoms: Secondary | ICD-10-CM | POA: Diagnosis not present

## 2022-09-13 DIAGNOSIS — M1990 Unspecified osteoarthritis, unspecified site: Secondary | ICD-10-CM | POA: Diagnosis not present

## 2022-09-13 DIAGNOSIS — H40013 Open angle with borderline findings, low risk, bilateral: Secondary | ICD-10-CM | POA: Diagnosis not present

## 2022-09-13 DIAGNOSIS — G459 Transient cerebral ischemic attack, unspecified: Secondary | ICD-10-CM | POA: Diagnosis not present

## 2022-09-13 DIAGNOSIS — K219 Gastro-esophageal reflux disease without esophagitis: Secondary | ICD-10-CM | POA: Diagnosis not present

## 2022-09-13 DIAGNOSIS — R195 Other fecal abnormalities: Secondary | ICD-10-CM | POA: Diagnosis not present

## 2022-09-13 DIAGNOSIS — H40033 Anatomical narrow angle, bilateral: Secondary | ICD-10-CM | POA: Diagnosis not present

## 2022-09-13 DIAGNOSIS — J302 Other seasonal allergic rhinitis: Secondary | ICD-10-CM | POA: Diagnosis not present

## 2022-09-13 DIAGNOSIS — E785 Hyperlipidemia, unspecified: Secondary | ICD-10-CM | POA: Diagnosis not present

## 2022-11-30 ENCOUNTER — Ambulatory Visit (INDEPENDENT_AMBULATORY_CARE_PROVIDER_SITE_OTHER): Payer: Medicare Other | Admitting: Podiatry

## 2022-11-30 ENCOUNTER — Encounter: Payer: Self-pay | Admitting: Podiatry

## 2022-11-30 VITALS — BP 129/63

## 2022-11-30 DIAGNOSIS — Q828 Other specified congenital malformations of skin: Secondary | ICD-10-CM | POA: Diagnosis not present

## 2022-11-30 DIAGNOSIS — M79674 Pain in right toe(s): Secondary | ICD-10-CM | POA: Diagnosis not present

## 2022-11-30 DIAGNOSIS — B351 Tinea unguium: Secondary | ICD-10-CM

## 2022-11-30 DIAGNOSIS — M79675 Pain in left toe(s): Secondary | ICD-10-CM | POA: Diagnosis not present

## 2022-11-30 DIAGNOSIS — M79672 Pain in left foot: Secondary | ICD-10-CM

## 2022-11-30 NOTE — Progress Notes (Unsigned)
  Subjective:  Patient ID: David Stanton, male    DOB: [birth year],  MRN: [id number]  David Stanton presents to clinic today for {jgcomplaint:23593}  Chief Complaint  Patient presents with   Nail Problem    RFC PCP-[facility] PCP VST- 1 month ago   New problem(s): None. {jgcomplaint:23593}  PCP is Avva, Ravisankar, MD.  Allergies  Allergen Reactions   Omeprazole     Other reaction(s): The addivities that are in the medicine   Tamsulosin     Other reaction(s): Vertigo Other reaction(s): Vertigo    Review of Systems: Negative except as noted in the HPI.  Objective: No changes noted in today's physical examination. Vitals:   11/30/22 0949  BP: 129/63   David Stanton is a pleasant 88 y.o. male {jgbodyhabitus:24098} AAO x 3.  Vascular:  CFT <3 seconds b/l LE. Palpable DP/PT pulses b/l LE. Digital hair present b/l. Skin temperature gradient WNL b/l. No pain with calf compression b/l. No edema noted b/l. No cyanosis or clubbing noted b/l LE.   Dermatological:  Pedal integument with normal turgor, texture and tone b/l LE. No open wounds b/l. No interdigital macerations b/l.   Toenails 1-5 b/l elongated, thickened, discolored with subungual debris. +Tenderness with dorsal palpation of nailplates.   Porokeratotic lesion(s) noted submet head 5 left foot.   Evidence of partial matrixectomy bilateral great toes. Procedure site of bilateral great toes noted to be completely healed with no erythema, no edema, no drainage, no purulence.    Musculoskeletal:  Normal muscle strength 5/5 to all lower extremity muscle groups bilaterally. No pain, crepitus or joint limitation noted with ROM b/l LE. No gross bony pedal deformities b/l. Patient ambulates independently without assistive aids.   Neurological:  Protective sensation intact 5/5 intact bilaterally with 10g monofilament b/l. Vibratory sensation intact b/l. Proprioception intact bilaterally.   Assessment/Plan: 1. Pain due to  onychomycosis of toenails of both feet   2. Porokeratosis   3. Left foot pain     No orders of the defined types were placed in this encounter.   None {Jgplan:23602::"-Patient/POA to call should there be question/concern in the interim."}   Return in about 3 months (around 03/01/2023).  Freddie Breech, DPM

## 2023-01-13 DIAGNOSIS — H00021 Hordeolum internum right upper eyelid: Secondary | ICD-10-CM | POA: Diagnosis not present

## 2023-03-07 DIAGNOSIS — H40013 Open angle with borderline findings, low risk, bilateral: Secondary | ICD-10-CM | POA: Diagnosis not present

## 2023-03-07 DIAGNOSIS — H348112 Central retinal vein occlusion, right eye, stable: Secondary | ICD-10-CM | POA: Diagnosis not present

## 2023-03-07 DIAGNOSIS — H25813 Combined forms of age-related cataract, bilateral: Secondary | ICD-10-CM | POA: Diagnosis not present

## 2023-03-07 DIAGNOSIS — H353122 Nonexudative age-related macular degeneration, left eye, intermediate dry stage: Secondary | ICD-10-CM | POA: Diagnosis not present

## 2023-03-07 DIAGNOSIS — H40033 Anatomical narrow angle, bilateral: Secondary | ICD-10-CM | POA: Diagnosis not present

## 2023-03-14 ENCOUNTER — Ambulatory Visit: Payer: Medicare Other | Admitting: Podiatry

## 2023-03-15 ENCOUNTER — Ambulatory Visit (INDEPENDENT_AMBULATORY_CARE_PROVIDER_SITE_OTHER): Payer: Medicare Other | Admitting: Podiatry

## 2023-03-15 DIAGNOSIS — I739 Peripheral vascular disease, unspecified: Secondary | ICD-10-CM

## 2023-03-15 DIAGNOSIS — L84 Corns and callosities: Secondary | ICD-10-CM | POA: Diagnosis not present

## 2023-03-15 DIAGNOSIS — B351 Tinea unguium: Secondary | ICD-10-CM

## 2023-03-15 NOTE — Progress Notes (Signed)
    Subjective:  Patient ID: David Stanton, male    DOB: 1935-07-29,  MRN: [id number]  Patient presents today for at risk footcare.  He also has painful calluses on the bottom of both feet.  His left submet 5 callus is more painful than the right.  PCP is Avva, Ravisankar, MD. date last seen was 09/13/2022.  Allergies  Allergen Reactions   Omeprazole     Other reaction(s): The addivities that are in the medicine   Tamsulosin     Other reaction(s): Vertigo Other reaction(s): Vertigo   Review of Systems: Negative except as noted in the HPI.  Objective:  There were no vitals filed for this visit.  David Stanton is a pleasant 88 y.o. male in NAD. AAO x 3.  Vascular Examination: Patient has palpable DP pulse, absent PT pulse bilateral.  Delayed capillary refill bilateral toes.  Sparse digital hair bilateral.  Proximal to distal cooling WNL bilateral.    Dermatological Examination: Interspaces are clear with no open lesions noted bilateral.  Nails are 3-72mm thick, with yellowish/brown discoloration, subungual debris and distal onycholysis x10.  There is pain with compression of nails x10.  There are hyperkeratotic lesions noted submet 5 bilateral.  Patient qualifies for at-risk foot care because of PVD.  Assessment/Plan: 1. Dermatophytosis of nail   2. Callus of foot   3. PVD (peripheral vascular disease) (HCC)     Mycotic nails x10 were sharply debrided with sterile nail nippers and power debriding burr to decrease bulk and length.  Hyperkeratotic lesions x 2 were shaved with #312 blade.  A felt offloading pad was applied to his left submet 5 area underneath his shoe insole.  If this provides relief he should make an appointment for custom orthotics.  These will not be covered by his insurance.  However these can be customized to offload the high pressure areas to decrease his callus formation and therefore decrease his pain and improve his ambulation.  Return in about 3 months  (around 06/15/2023) for RFC.   Clerance Lav, DPM, FACFAS [facility]     [address] [place], [place] [address]                Office [phone]  Fax [phone]

## 2023-04-01 DIAGNOSIS — N400 Enlarged prostate without lower urinary tract symptoms: Secondary | ICD-10-CM | POA: Diagnosis not present

## 2023-04-01 DIAGNOSIS — E785 Hyperlipidemia, unspecified: Secondary | ICD-10-CM | POA: Diagnosis not present

## 2023-04-01 DIAGNOSIS — N1832 Chronic kidney disease, stage 3b: Secondary | ICD-10-CM | POA: Diagnosis not present

## 2023-04-07 DIAGNOSIS — H00021 Hordeolum internum right upper eyelid: Secondary | ICD-10-CM | POA: Diagnosis not present

## 2023-04-08 DIAGNOSIS — Z1339 Encounter for screening examination for other mental health and behavioral disorders: Secondary | ICD-10-CM | POA: Diagnosis not present

## 2023-04-08 DIAGNOSIS — K219 Gastro-esophageal reflux disease without esophagitis: Secondary | ICD-10-CM | POA: Diagnosis not present

## 2023-04-08 DIAGNOSIS — Z0000 Encounter for general adult medical examination without abnormal findings: Secondary | ICD-10-CM | POA: Diagnosis not present

## 2023-04-08 DIAGNOSIS — R7301 Impaired fasting glucose: Secondary | ICD-10-CM | POA: Diagnosis not present

## 2023-04-08 DIAGNOSIS — R279 Unspecified lack of coordination: Secondary | ICD-10-CM | POA: Diagnosis not present

## 2023-04-08 DIAGNOSIS — M1990 Unspecified osteoarthritis, unspecified site: Secondary | ICD-10-CM | POA: Diagnosis not present

## 2023-04-08 DIAGNOSIS — Z1331 Encounter for screening for depression: Secondary | ICD-10-CM | POA: Diagnosis not present

## 2023-04-08 DIAGNOSIS — N1832 Chronic kidney disease, stage 3b: Secondary | ICD-10-CM | POA: Diagnosis not present

## 2023-04-08 DIAGNOSIS — N400 Enlarged prostate without lower urinary tract symptoms: Secondary | ICD-10-CM | POA: Diagnosis not present

## 2023-04-08 DIAGNOSIS — R195 Other fecal abnormalities: Secondary | ICD-10-CM | POA: Diagnosis not present

## 2023-04-08 DIAGNOSIS — H348192 Central retinal vein occlusion, unspecified eye, stable: Secondary | ICD-10-CM | POA: Diagnosis not present

## 2023-04-08 DIAGNOSIS — E785 Hyperlipidemia, unspecified: Secondary | ICD-10-CM | POA: Diagnosis not present

## 2023-05-05 DIAGNOSIS — Z23 Encounter for immunization: Secondary | ICD-10-CM | POA: Diagnosis not present

## 2023-06-06 ENCOUNTER — Telehealth: Payer: Self-pay | Admitting: Cardiology

## 2023-06-06 NOTE — Telephone Encounter (Signed)
Left message to call office

## 2023-06-06 NOTE — Telephone Encounter (Signed)
Calling to get a copy of patient last office notes, states patient is about to have surgery. Fax [phone]. Please advise

## 2023-06-14 DIAGNOSIS — H348112 Central retinal vein occlusion, right eye, stable: Secondary | ICD-10-CM | POA: Diagnosis not present

## 2023-06-14 DIAGNOSIS — H40033 Anatomical narrow angle, bilateral: Secondary | ICD-10-CM | POA: Diagnosis not present

## 2023-06-14 DIAGNOSIS — H353122 Nonexudative age-related macular degeneration, left eye, intermediate dry stage: Secondary | ICD-10-CM | POA: Diagnosis not present

## 2023-06-14 DIAGNOSIS — H40013 Open angle with borderline findings, low risk, bilateral: Secondary | ICD-10-CM | POA: Diagnosis not present

## 2023-06-14 DIAGNOSIS — H25813 Combined forms of age-related cataract, bilateral: Secondary | ICD-10-CM | POA: Diagnosis not present

## 2023-06-15 ENCOUNTER — Encounter: Payer: Self-pay | Admitting: Podiatry

## 2023-06-15 ENCOUNTER — Ambulatory Visit (INDEPENDENT_AMBULATORY_CARE_PROVIDER_SITE_OTHER): Payer: Medicare Other | Admitting: Podiatry

## 2023-06-15 DIAGNOSIS — M79674 Pain in right toe(s): Secondary | ICD-10-CM | POA: Diagnosis not present

## 2023-06-15 DIAGNOSIS — B351 Tinea unguium: Secondary | ICD-10-CM

## 2023-06-15 DIAGNOSIS — M79672 Pain in left foot: Secondary | ICD-10-CM

## 2023-06-15 DIAGNOSIS — Q828 Other specified congenital malformations of skin: Secondary | ICD-10-CM

## 2023-06-15 DIAGNOSIS — M79675 Pain in left toe(s): Secondary | ICD-10-CM | POA: Diagnosis not present

## 2023-06-19 NOTE — Progress Notes (Signed)
  Subjective:  Patient ID: David Stanton, male    DOB: June 18, 1935,  MRN: [id number]  88 y.o. male presents painful porokeratotic lesion(s) left foot and painful mycotic toenails that limit ambulation. Painful toenails interfere with ambulation. Aggravating factors include wearing enclosed shoe gear. Pain is relieved with periodic professional debridement. Painful porokeratotic lesions are aggravated when weightbearing with and without shoegear. Pain is relieved with periodic professional debridement.  Chief Complaint  Patient presents with   RFC    RFC / CALLUSES   New problem(s): None   PCP is Avva, Ravisankar, MD , and last visit was April 08, 2023.  Allergies  Allergen Reactions   Omeprazole     Other reaction(s): The addivities that are in the medicine   Tamsulosin     Other reaction(s): Vertigo Other reaction(s): Vertigo    Review of Systems: Negative except as noted in the HPI.   Objective:  RONTRELL MIRO is a pleasant 88 y.o. male WD, WN in NAD. AAO x 3.  Vascular Examination: Vascular status intact b/l with palpable pedal pulses. CFT immediate b/l. Pedal hair present. No edema. No pain with calf compression b/l. Skin temperature gradient WNL b/l. No varicosities noted. No cyanosis or clubbing noted.  Neurological Examination: Sensation grossly intact b/l with 10 gram monofilament. Vibratory sensation intact b/l.  Dermatological Examination: Pedal skin with normal turgor, texture and tone b/l. No open wounds nor interdigital macerations noted. Toenails 1-5 b/l thick, discolored, elongated with subungual debris and pain on dorsal palpation.   Porokeratotic lesion(s) submet head 5 left foot. No erythema, no edema, no drainage, no fluctuance.  Musculoskeletal Examination: Muscle strength 5/5 to b/l LE.  No pain, crepitus noted b/l. No gross pedal deformities. Patient ambulates independently without assistive aids.   Radiographs: None  Last A1c:       No data to  display           Assessment:   1. Pain due to onychomycosis of toenails of both feet   2. Porokeratosis   3. Left foot pain    Plan:  -Consent given for treatment as described below: -Examined patient. -Continue supportive shoe gear daily. -Toenails 1-5 b/l were debrided in length and girth with sterile nail nippers and dremel without iatrogenic bleeding.  -Porokeratotic lesion(s) submet head 5 left foot pared and enucleated with sterile currette without incident. Total number of lesions debrided=1. -Patient/POA to call should there be question/concern in the interim.  Return in about 3 months (around 09/15/2023).  Freddie Breech, DPM      [place] LOCATION: [address], [place] [address]                   Office [phone]   [facility] LOCATION: [address] [place], [place] [address] Office [phone]

## 2023-06-22 NOTE — Progress Notes (Signed)
I am receiving routine eye care notes. It increases our work and unless there is something I (cardiology in general) have to address a change, do not know if it adds to the care of the patient. If vascular disease is suspected or needs medication change, then it makes sense. Please inform Dr. Cherlyn Cushing practice.

## 2023-06-29 NOTE — Telephone Encounter (Signed)
I placed call to [facility] to see if any additional information needed.  Left message to call office if needed.  Fax number confirmed and last office note faxed.

## 2023-08-22 ENCOUNTER — Ambulatory Visit: Payer: Self-pay | Admitting: Cardiology

## 2023-08-31 DIAGNOSIS — D692 Other nonthrombocytopenic purpura: Secondary | ICD-10-CM | POA: Diagnosis not present

## 2023-08-31 DIAGNOSIS — L821 Other seborrheic keratosis: Secondary | ICD-10-CM | POA: Diagnosis not present

## 2023-08-31 DIAGNOSIS — L570 Actinic keratosis: Secondary | ICD-10-CM | POA: Diagnosis not present

## 2023-08-31 DIAGNOSIS — L218 Other seborrheic dermatitis: Secondary | ICD-10-CM | POA: Diagnosis not present

## 2023-08-31 DIAGNOSIS — L810 Postinflammatory hyperpigmentation: Secondary | ICD-10-CM | POA: Diagnosis not present

## 2023-08-31 DIAGNOSIS — L820 Inflamed seborrheic keratosis: Secondary | ICD-10-CM | POA: Diagnosis not present

## 2023-08-31 DIAGNOSIS — Z85828 Personal history of other malignant neoplasm of skin: Secondary | ICD-10-CM | POA: Diagnosis not present

## 2023-09-14 ENCOUNTER — Ambulatory Visit: Payer: Medicare Other | Attending: Cardiology | Admitting: Cardiology

## 2023-09-14 ENCOUNTER — Encounter: Payer: Self-pay | Admitting: Cardiology

## 2023-09-14 VITALS — BP 130/74 | HR 83 | Resp 16 | Ht 72.0 in | Wt 192.4 lb

## 2023-09-14 DIAGNOSIS — R001 Bradycardia, unspecified: Secondary | ICD-10-CM

## 2023-09-14 DIAGNOSIS — E7800 Pure hypercholesterolemia, unspecified: Secondary | ICD-10-CM | POA: Diagnosis not present

## 2023-09-14 DIAGNOSIS — I440 Atrioventricular block, first degree: Secondary | ICD-10-CM | POA: Diagnosis not present

## 2023-09-14 NOTE — Patient Instructions (Signed)
Medication Instructions:  Your physician recommends that you continue on your current medications as directed. Please refer to the Current Medication list given to you today.  *If you need a refill on your cardiac medications before your next appointment, please call your pharmacy*   Lab Work: none If you have labs (blood work) drawn today and your tests are completely normal, you will receive your results only by: MyChart Message (if you have MyChart) OR A paper copy in the mail If you have any lab test that is abnormal or we need to change your treatment, we will call you to review the results.   Testing/Procedures: none   Follow-Up: At [facility], you and your health needs are our priority.  As part of our continuing mission to provide you with exceptional heart care, we have created designated Provider Care Teams.  These Care Teams include your primary Cardiologist (physician) and Advanced Practice Providers (APPs -  Physician Assistants and Nurse Practitioners) who all work together to provide you with the care you need, when you need it.  We recommend signing up for the patient portal called "MyChart".  Sign up information is provided on this After Visit Summary.  MyChart is used to connect with patients for Virtual Visits (Telemedicine).  Patients are able to view lab/test results, encounter notes, upcoming appointments, etc.  Non-urgent messages can be sent to your provider as well.   To learn more about what you can do with MyChart, go to [email or web address].    Your next appointment:   12 month(s)  Provider:   Dr Jacinto Halim    Other Instructions

## 2023-09-14 NOTE — Progress Notes (Signed)
Cardiology Office Note:  .   Date:  09/14/2023  ID:  Domingo Dimes, DOB [birth year], MRN [id number] PCP: Chilton Greathouse, MD  [facility] Providers Cardiologist:  None   History of Present Illness: .   David Stanton is a 89 y.o. Caucasian male with history of HTN, stage 3a-b CKD, hyperlipidemia, and recurrent cortical TIA leading to visual disturbances ongoing since 1990s and last episode in 2014 and no recurrence since lipids controlled with the combination of pravastatin and fenofibric acid and also presently on Plavix indefinitely. He also has asymptomatic profound bradycardia for many years with HR 35 to 40/min.   Discussed the use of AI scribe software for clinical note transcription with the patient, who gave verbal consent to proceed.  History of Present Illness   The patient, an 89 year old with a history of hyperlipidemia, presents with a decline in balance over the past four years. He reports no issues with stamina and continues to walk trails with the aid of a walking stick. He has not experienced any episodes of feeling like he is going to pass out or hitting a wall of sudden fatigue. The patient also mentions a decrease in physical activity since the loss of his dog to Cushing's disease. He has not noticed any changes in bowel habits.      Labs   External Labs:  K PN PCP labs 04/01/2023:  Total cholesterol 138, triglycerides 114, HDL 45, LDL 70.  Hb 14.1.  Review of Systems  Cardiovascular:  Negative for chest pain, dyspnea on exertion and leg swelling.   Physical Exam:   VS:  BP 130/74 (BP Location: Left Arm, Patient Position: Sitting, Cuff Size: Normal)   Pulse 83   Resp 16   Ht 6' (1.829 m)   Wt 192 lb 6.4 oz (87.3 kg)   SpO2 97%   BMI 26.09 kg/m    Wt Readings from Last 3 Encounters:  09/14/23 192 lb 6.4 oz (87.3 kg)  08/20/22 196 lb 12.8 oz (89.3 kg)  10/17/21 188 lb (85.3 kg)    Physical Exam Neck:     Vascular: No carotid bruit or JVD.   Cardiovascular:     Rate and Rhythm: Regular rhythm. Bradycardia present.     Pulses: Intact distal pulses.     Heart sounds: Normal heart sounds. No murmur heard.    No gallop.  Pulmonary:     Effort: Pulmonary effort is normal.     Breath sounds: Normal breath sounds.  Abdominal:     General: Bowel sounds are normal.     Palpations: Abdomen is soft.  Musculoskeletal:     Right lower leg: No edema.     Left lower leg: No edema.    Studies Reviewed: Marland Kitchen    Stress EKG 04/03/2013: Indications: Bradycardia, Screening for CAD. Conclusions: Normal HR response to exercise. Normal BP. Good exercise tolerence. Continue primary prevention. No indication for pacemaker. Symptoms: THR reached. No symptoms. Arrhythmia: PAC and Occasional non conducted PAC. The patient exercised according to the Bruce protocol, Total time recorded 6 Min. 2 sec. achieving a max heart rate of 172 which was 119% of MPHR for age and 7.3 METS of work. 3 mm ST depression back to baseline immediately into recovery  EKG:    EKG Interpretation Date/Time:  Wednesday September 14 2023 10:43:29 EST Ventricular Rate:  49 PR Interval:  238 QRS Duration:  82 QT Interval:  436 QTC Calculation: 393 R Axis:   10  Text Interpretation:  EKG 09/14/2023: Sinus bradycardia with first-degree block at the rate of 49 bpm, otherwise normal EKG.  Compared to 08/20/2022, heart rate has reduced from 68 bpm. Confirmed by Delrae Rend [phone]) on 09/14/2023 11:05:45 AM    Medications and allergies    Allergies  Allergen Reactions   Omeprazole     Other reaction(s): The addivities that are in the medicine   Tamsulosin     Other reaction(s): Vertigo Other reaction(s): Vertigo     Current Outpatient Medications:    acetaminophen (TYLENOL) 500 MG tablet, Take 500 mg by mouth every 6 (six) hours as needed., Disp: , Rfl:    cetirizine (ZYRTEC) 10 MG tablet, Take 1 tablet by mouth daily as needed., Disp: , Rfl:    clobetasol cream  (TEMOVATE) 0.05 %, Apply 1 application topically 2 (two) times daily., Disp: , Rfl:    clopidogrel (PLAVIX) 75 MG tablet, Take 1 tablet by mouth daily., Disp: , Rfl:    diclofenac Sodium (VOLTAREN) 1 % GEL, , Disp: , Rfl:    dutasteride (AVODART) 0.5 MG capsule, Take 0.5 mg by mouth daily., Disp: , Rfl:    fenofibrate (TRICOR) 48 MG tablet, Take 1 tablet by mouth daily., Disp: , Rfl:    Multiple Vitamins-Minerals (PRESERVISION AREDS 2) CAPS, Take 1 capsule by mouth See admin instructions., Disp: , Rfl:    pantoprazole (PROTONIX) 40 MG tablet, Take 1 tablet by mouth every 12 (twelve) hours., Disp: , Rfl:    pravastatin (PRAVACHOL) 40 MG tablet, Take 1 tablet by mouth at bedtime., Disp: , Rfl:    Vitamin D, Cholecalciferol, 25 MCG (1000 UT) TABS, Take by mouth., Disp: , Rfl:    fluorouracil (EFUDEX) 5 % cream, APPLY TO THE AFFECTED AREAS NIGHTLY FOR 14 DAYS (Patient not taking: Reported on 09/14/2023), Disp: , Rfl:    ASSESSMENT AND PLAN: .      ICD-10-CM   1. 1st degree AV block  I44.0 EKG 12-Lead    2. Bradycardia by electrocardiography  R00.1     3. Hypercholesteremia  E78.00       Assessment and Plan    Bradycardia   Heart rate is 49 bpm on EKG, consistent with a historical baseline of 46 bpm. No symptoms such as fatigue, dizziness, or syncope are present. He remains active, walking trails with a walking stick. Continue current management and monitor for new symptoms.  Hypertension   Blood pressure is well-controlled at 130/74 mmHg. Currently on Plavix indefinitely. Continue current antihypertensive regimen and monitor blood pressure regularly.  Hyperlipidemia   On fenofibrate and pravastatin. Lipid panel shows total cholesterol 138 mg/dL, triglycerides 604 mg/dL, HDL 45 mg/dL, LDL 70 mg/dL, indicating well-controlled lipid levels. Continue fenofibrate and pravastatin. Reassess lipid panel in one year.  Balance Issues   Reports decline in balance and stability, progressively  worsening since age 40. Uses a walking stick for support, especially on uneven terrain. Recommend physical therapy for balance training and encourage continued use of walking stick for stability.  General Health Maintenance   Generally in good health for his age and remains active. Continue regular physical activity. Follow up in one year for routine check-up.  Follow-up   Schedule follow-up appointment in one year.           Signed,  Yates Decamp, MD, [facility] 09/14/2023, 11:23 AM [facility] [address] [place], [place] [address] Phone: [phone]. Fax:  [phone]

## 2023-09-16 DIAGNOSIS — H353122 Nonexudative age-related macular degeneration, left eye, intermediate dry stage: Secondary | ICD-10-CM | POA: Diagnosis not present

## 2023-09-16 DIAGNOSIS — H40033 Anatomical narrow angle, bilateral: Secondary | ICD-10-CM | POA: Diagnosis not present

## 2023-09-16 DIAGNOSIS — H40013 Open angle with borderline findings, low risk, bilateral: Secondary | ICD-10-CM | POA: Diagnosis not present

## 2023-09-16 DIAGNOSIS — H35033 Hypertensive retinopathy, bilateral: Secondary | ICD-10-CM | POA: Diagnosis not present

## 2023-09-19 ENCOUNTER — Ambulatory Visit (INDEPENDENT_AMBULATORY_CARE_PROVIDER_SITE_OTHER): Payer: Medicare Other | Admitting: Podiatry

## 2023-09-19 DIAGNOSIS — M79674 Pain in right toe(s): Secondary | ICD-10-CM

## 2023-09-19 DIAGNOSIS — I739 Peripheral vascular disease, unspecified: Secondary | ICD-10-CM | POA: Diagnosis not present

## 2023-09-19 DIAGNOSIS — M79675 Pain in left toe(s): Secondary | ICD-10-CM

## 2023-09-19 DIAGNOSIS — L84 Corns and callosities: Secondary | ICD-10-CM | POA: Diagnosis not present

## 2023-09-19 DIAGNOSIS — B351 Tinea unguium: Secondary | ICD-10-CM | POA: Diagnosis not present

## 2023-09-19 NOTE — Progress Notes (Signed)
       Subjective:  Patient ID: David Stanton, male    DOB: 10/04/1934,  MRN: [id number]  David Stanton presents to clinic today for:  Chief Complaint  Patient presents with   rfc    Nail trim    Patient notes nails are thick and elongated, causing pain in shoe gear when ambulating.  He has recurring, painful calluses b/l submet 5  PCP is Avva, Ravisankar, MD.  Last seen 05/05/23.  Past Medical History:  Diagnosis Date   High cholesterol    Retinal hemorrhage     Allergies  Allergen Reactions   Omeprazole     Other reaction(s): The addivities that are in the medicine   Tamsulosin     Other reaction(s): Vertigo Other reaction(s): Vertigo    Objective:  David Stanton is a pleasant 89 y.o. male in NAD. AAO x 3.  Vascular Examination: Patient has palpable DP pulse, absent PT pulse bilateral.  Delayed capillary refill bilateral toes.  Sparse digital hair bilateral.  Proximal to distal cooling WNL bilateral.    Dermatological Examination: Interspaces are clear with no open lesions noted bilateral.  Skin is shiny and atrophic bilateral.  Nails are 3-80mm thick, with yellowish/brown discoloration, subungual debris and distal onycholysis x10.  There is pain with compression of nails x10.  There are hyperkeratotic lesions noted b/l submet 5 .  Patient qualifies for at-risk foot care because of PVD .  Assessment/Plan: 1. Pain due to onychomycosis of toenails of both feet   2. Callus of foot   3. PVD (peripheral vascular disease) (HCC)    Mycotic nails x10 were sharply debrided with sterile nail nippers and power debriding burr to decrease bulk and length.  Hyperkeratotic lesions b/l submet 5 were shaved with #312 blade.  Return in about 3 months (around 12/17/2023) for RFC.   Clerance Lav, DPM, FACFAS [facility]     [address] [place], [place] [address]                Office [phone]  Fax [phone]

## 2023-10-11 DIAGNOSIS — M1990 Unspecified osteoarthritis, unspecified site: Secondary | ICD-10-CM | POA: Diagnosis not present

## 2023-10-11 DIAGNOSIS — R7301 Impaired fasting glucose: Secondary | ICD-10-CM | POA: Diagnosis not present

## 2023-10-11 DIAGNOSIS — E785 Hyperlipidemia, unspecified: Secondary | ICD-10-CM | POA: Diagnosis not present

## 2023-10-11 DIAGNOSIS — J302 Other seasonal allergic rhinitis: Secondary | ICD-10-CM | POA: Diagnosis not present

## 2023-10-11 DIAGNOSIS — R279 Unspecified lack of coordination: Secondary | ICD-10-CM | POA: Diagnosis not present

## 2023-10-11 DIAGNOSIS — H348192 Central retinal vein occlusion, unspecified eye, stable: Secondary | ICD-10-CM | POA: Diagnosis not present

## 2023-10-11 DIAGNOSIS — Z8673 Personal history of transient ischemic attack (TIA), and cerebral infarction without residual deficits: Secondary | ICD-10-CM | POA: Diagnosis not present

## 2023-10-11 DIAGNOSIS — N400 Enlarged prostate without lower urinary tract symptoms: Secondary | ICD-10-CM | POA: Diagnosis not present

## 2023-10-11 DIAGNOSIS — N1832 Chronic kidney disease, stage 3b: Secondary | ICD-10-CM | POA: Diagnosis not present

## 2023-10-11 DIAGNOSIS — K219 Gastro-esophageal reflux disease without esophagitis: Secondary | ICD-10-CM | POA: Diagnosis not present

## 2023-10-11 DIAGNOSIS — R195 Other fecal abnormalities: Secondary | ICD-10-CM | POA: Diagnosis not present

## 2023-11-29 DIAGNOSIS — H353122 Nonexudative age-related macular degeneration, left eye, intermediate dry stage: Secondary | ICD-10-CM | POA: Diagnosis not present

## 2023-11-29 DIAGNOSIS — H40033 Anatomical narrow angle, bilateral: Secondary | ICD-10-CM | POA: Diagnosis not present

## 2023-11-29 DIAGNOSIS — H25813 Combined forms of age-related cataract, bilateral: Secondary | ICD-10-CM | POA: Diagnosis not present

## 2023-11-29 DIAGNOSIS — H524 Presbyopia: Secondary | ICD-10-CM | POA: Diagnosis not present

## 2023-11-29 DIAGNOSIS — H348112 Central retinal vein occlusion, right eye, stable: Secondary | ICD-10-CM | POA: Diagnosis not present

## 2023-11-29 DIAGNOSIS — H52213 Irregular astigmatism, bilateral: Secondary | ICD-10-CM | POA: Diagnosis not present

## 2023-11-29 DIAGNOSIS — H40013 Open angle with borderline findings, low risk, bilateral: Secondary | ICD-10-CM | POA: Diagnosis not present

## 2023-12-13 ENCOUNTER — Ambulatory Visit (INDEPENDENT_AMBULATORY_CARE_PROVIDER_SITE_OTHER): Payer: Medicare Other | Admitting: Podiatry

## 2023-12-13 ENCOUNTER — Encounter: Payer: Self-pay | Admitting: Podiatry

## 2023-12-13 VITALS — Ht 72.0 in | Wt 192.4 lb

## 2023-12-13 DIAGNOSIS — M79675 Pain in left toe(s): Secondary | ICD-10-CM

## 2023-12-13 DIAGNOSIS — Q828 Other specified congenital malformations of skin: Secondary | ICD-10-CM | POA: Diagnosis not present

## 2023-12-13 DIAGNOSIS — M79674 Pain in right toe(s): Secondary | ICD-10-CM

## 2023-12-13 DIAGNOSIS — B351 Tinea unguium: Secondary | ICD-10-CM | POA: Diagnosis not present

## 2023-12-13 DIAGNOSIS — I739 Peripheral vascular disease, unspecified: Secondary | ICD-10-CM

## 2023-12-13 NOTE — Progress Notes (Signed)
  Subjective:  Patient ID: David Stanton, male    DOB: [birth year],  MRN: [id number]  89 y.o. male presents at risk foot care. Patient has h/o PAD and painful porokeratotic lesion(s) left foot and painful mycotic toenails that limit ambulation. Painful toenails interfere with ambulation. Aggravating factors include wearing enclosed shoe gear. Pain is relieved with periodic professional debridement. Painful porokeratotic lesions are aggravated when weightbearing with and without shoegear. Pain is relieved with periodic professional debridement. Chief Complaint  Patient presents with   Nail Problem    Pt is here for [facility] PCP is Dr Thor Fling and LOV was in March.   New problem(s): None   PCP is Avva, Ravisankar, MD.  Allergies  Allergen Reactions   Omeprazole     Other reaction(s): The addivities that are in the medicine   Tamsulosin     Other reaction(s): Vertigo Other reaction(s): Vertigo   Review of Systems: Negative except as noted in the HPI.   Objective:  David Stanton is a pleasant 89 y.o. male overweight, in NAD. AAO x 3.  Vascular Examination: Vascular status intact b/l with palpable DP pulses. Nonpalpable PT pulses b/l. CFT immediate b/l. Pedal hair present. No edema. No pain with calf compression b/l. Skin temperature gradient WNL b/l. No varicosities noted. No cyanosis or clubbing noted.  Neurological Examination: Sensation grossly intact b/l with 10 gram monofilament. Vibratory sensation intact b/l.  Dermatological Examination: Pedal skin with normal turgor, texture and tone b/l. No open wounds nor interdigital macerations noted. Toenails 1-5 b/l thick, discolored, elongated with subungual debris and pain on dorsal palpation.   Porokeratotic lesion(s) submet head 5 left foot. No erythema, no edema, no drainage, no fluctuance.  Musculoskeletal Examination: Muscle strength 5/5 to b/l LE.  No pain, crepitus noted b/l. No gross pedal deformities. Patient ambulates independently  without assistive aids.   Radiographs: None  Last A1c:       No data to display         Assessment:   1. Pain due to onychomycosis of toenails of both feet   2. Porokeratosis   3. PVD (peripheral vascular disease) (HCC)    Plan:  Patient was evaluated and treated. All patient's and/or POA's questions/concerns addressed on today's visit. Toenails 1-5 debrided in length and girth without incident. Porokeratotic lesion(s) submet head 5 left foot pared with sharp debridement without incident. Continue soft, supportive shoe gear daily. Report any pedal injuries to medical professional. Call office if there are any questions/concerns. -Patient/POA to call should there be question/concern in the interim.  Return in about 3 months (around 03/14/2024).  David Stanton, DPM      [place] LOCATION: [address], [place] [address]                   Office [phone]   [facility] LOCATION: [address] [place], [place] [address] Office [phone]

## 2023-12-28 DIAGNOSIS — H2511 Age-related nuclear cataract, right eye: Secondary | ICD-10-CM | POA: Diagnosis not present

## 2023-12-28 DIAGNOSIS — H25813 Combined forms of age-related cataract, bilateral: Secondary | ICD-10-CM | POA: Diagnosis not present

## 2024-01-09 DIAGNOSIS — H25812 Combined forms of age-related cataract, left eye: Secondary | ICD-10-CM | POA: Diagnosis not present

## 2024-02-08 DIAGNOSIS — H25812 Combined forms of age-related cataract, left eye: Secondary | ICD-10-CM | POA: Diagnosis not present

## 2024-02-08 DIAGNOSIS — H2512 Age-related nuclear cataract, left eye: Secondary | ICD-10-CM | POA: Diagnosis not present

## 2024-03-19 ENCOUNTER — Ambulatory Visit (INDEPENDENT_AMBULATORY_CARE_PROVIDER_SITE_OTHER): Payer: Medicare Other | Admitting: Podiatry

## 2024-03-19 ENCOUNTER — Encounter: Payer: Self-pay | Admitting: Podiatry

## 2024-03-19 DIAGNOSIS — L84 Corns and callosities: Secondary | ICD-10-CM | POA: Diagnosis not present

## 2024-03-19 DIAGNOSIS — M79675 Pain in left toe(s): Secondary | ICD-10-CM | POA: Diagnosis not present

## 2024-03-19 DIAGNOSIS — M79674 Pain in right toe(s): Secondary | ICD-10-CM

## 2024-03-19 DIAGNOSIS — B351 Tinea unguium: Secondary | ICD-10-CM | POA: Diagnosis not present

## 2024-03-19 DIAGNOSIS — I739 Peripheral vascular disease, unspecified: Secondary | ICD-10-CM
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
## 2024-04-04 DIAGNOSIS — Z125 Encounter for screening for malignant neoplasm of prostate: Secondary | ICD-10-CM | POA: Diagnosis not present

## 2024-04-04 DIAGNOSIS — E785 Hyperlipidemia, unspecified: Secondary | ICD-10-CM | POA: Diagnosis not present

## 2024-04-04 DIAGNOSIS — Z0189 Encounter for other specified special examinations: Secondary | ICD-10-CM | POA: Diagnosis not present

## 2024-04-04 DIAGNOSIS — N1832 Chronic kidney disease, stage 3b: Secondary | ICD-10-CM | POA: Diagnosis not present

## 2024-04-09 DIAGNOSIS — R82998 Other abnormal findings in urine: Secondary | ICD-10-CM | POA: Diagnosis not present

## 2024-04-09 DIAGNOSIS — J302 Other seasonal allergic rhinitis: Secondary | ICD-10-CM | POA: Diagnosis not present

## 2024-04-09 DIAGNOSIS — E785 Hyperlipidemia, unspecified: Secondary | ICD-10-CM | POA: Diagnosis not present

## 2024-04-09 DIAGNOSIS — I4891 Unspecified atrial fibrillation: Secondary | ICD-10-CM | POA: Diagnosis not present

## 2024-04-09 DIAGNOSIS — H348192 Central retinal vein occlusion, unspecified eye, stable: Secondary | ICD-10-CM | POA: Diagnosis not present

## 2024-04-09 DIAGNOSIS — R195 Other fecal abnormalities: Secondary | ICD-10-CM | POA: Diagnosis not present

## 2024-04-09 DIAGNOSIS — Z23 Encounter for immunization: Secondary | ICD-10-CM | POA: Diagnosis not present

## 2024-04-09 DIAGNOSIS — N400 Enlarged prostate without lower urinary tract symptoms: Secondary | ICD-10-CM | POA: Diagnosis not present

## 2024-04-09 DIAGNOSIS — Z0000 Encounter for general adult medical examination without abnormal findings: Secondary | ICD-10-CM | POA: Diagnosis not present

## 2024-04-09 DIAGNOSIS — R279 Unspecified lack of coordination: Secondary | ICD-10-CM | POA: Diagnosis not present

## 2024-04-09 DIAGNOSIS — Z1331 Encounter for screening for depression: Secondary | ICD-10-CM | POA: Diagnosis not present

## 2024-04-09 DIAGNOSIS — Z1339 Encounter for screening examination for other mental health and behavioral disorders: Secondary | ICD-10-CM | POA: Diagnosis not present

## 2024-04-09 DIAGNOSIS — M1990 Unspecified osteoarthritis, unspecified site: Secondary | ICD-10-CM | POA: Diagnosis not present

## 2024-04-09 DIAGNOSIS — N1832 Chronic kidney disease, stage 3b: Secondary | ICD-10-CM | POA: Diagnosis not present

## 2024-04-09 DIAGNOSIS — K219 Gastro-esophageal reflux disease without esophagitis: Secondary | ICD-10-CM | POA: Diagnosis not present

## 2024-04-17 DIAGNOSIS — I4819 Other persistent atrial fibrillation: Secondary | ICD-10-CM | POA: Insufficient documentation

## 2024-04-17 DIAGNOSIS — I4891 Unspecified atrial fibrillation: Secondary | ICD-10-CM | POA: Insufficient documentation

## 2024-04-17 NOTE — Assessment & Plan Note (Signed)
 Pt recently noted to be in atrial fibrillation at annual visit with PCP. CHADS2-VASc=6. Pt will need long term anticoagulation. ***
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
## 2024-04-18 ENCOUNTER — Ambulatory Visit: Attending: Cardiology | Admitting: Physician Assistant

## 2024-04-18 ENCOUNTER — Encounter: Payer: Self-pay | Admitting: Physician Assistant

## 2024-04-18 VITALS — BP 126/78 | HR 60 | Resp 16 | Ht 72.0 in | Wt 191.6 lb

## 2024-04-18 DIAGNOSIS — N1831 Chronic kidney disease, stage 3a: Secondary | ICD-10-CM | POA: Insufficient documentation

## 2024-04-18 DIAGNOSIS — I4891 Unspecified atrial fibrillation: Secondary | ICD-10-CM | POA: Diagnosis not present

## 2024-04-18 DIAGNOSIS — I739 Peripheral vascular disease, unspecified: Secondary | ICD-10-CM | POA: Insufficient documentation

## 2024-04-18 DIAGNOSIS — E7800 Pure hypercholesterolemia, unspecified: Secondary | ICD-10-CM | POA: Diagnosis not present

## 2024-04-18 MED ORDER — APIXABAN 2.5 MG PO TABS: 2.5000 mg | ORAL_TABLET | Freq: Two times a day (BID) | ORAL | 3 refills | Status: AC

## 2024-04-18 NOTE — Patient Instructions (Signed)
 Medication Instructions:  Your physician recommends that you continue on your current medications as directed. Please refer to the Current Medication list given to you today.  *If you need a refill on your cardiac medications before your next appointment, please call your pharmacy*  Lab Work: None ordered  If you have labs (blood work) drawn today and your tests are completely normal, you will receive your results only by: MyChart Message (if you have MyChart) OR A paper copy in the mail If you have any lab test that is abnormal or we need to change your treatment, we will call you to review the results.  Testing/Procedures: Your physician has requested that you have an echocardiogram. Echocardiography is a painless test that uses sound waves to create images of your heart. It provides your doctor with information about the size and shape of your heart and how well your heart's chambers and valves are working. This procedure takes approximately one hour. There are no restrictions for this procedure. Please do NOT wear cologne, perfume, aftershave, or lotions (deodorant is allowed). Please arrive 15 minutes prior to your appointment time.  Please note: We ask at that you not bring children with you during ultrasound (echo/ vascular) testing. Due to room size and safety concerns, children are not allowed in the ultrasound rooms during exams. Our front office staff cannot provide observation of children in our lobby area while testing is being conducted. An adult accompanying a patient to their appointment will only be allowed in the ultrasound room at the discretion of the ultrasound technician under special circumstances. We apologize for any inconvenience.   Your physician has requested that you have a lower extremity arterial exercise duplex W/ ABI'S.  During this test, exercise and ultrasound are used to evaluate arterial blood flow in the legs. Allow one hour for this exam. There are no  restrictions or special instructions.   Follow-Up: At [facility], you and your health needs are our priority.  As part of our continuing mission to provide you with exceptional heart care, our providers are all part of one team.  This team includes your primary Cardiologist (physician) and Advanced Practice Providers or APPs (Physician Assistants and Nurse Practitioners) who all work together to provide you with the care you need, when you need it.  Your next appointment:   2 week(s)  Provider:   Glendia Ferrier, PA-C          We recommend signing up for the patient portal called MyChart.  Sign up information is provided on this After Visit Summary.  MyChart is used to connect with patients for Virtual Visits (Telemedicine).  Patients are able to view lab/test results, encounter notes, upcoming appointments, etc.  Non-urgent messages can be sent to your provider as well.   To learn more about what you can do with MyChart, go to [email or web address].   Other Instructions

## 2024-04-30 ENCOUNTER — Encounter (HOSPITAL_COMMUNITY)

## 2024-05-02 NOTE — H&P (View-Only) (Signed)
 OFFICE NOTE:    Date:  05/04/2024  ID:  David Stanton, DOB 1934-08-28, MRN [id number] PCP: David Santos, MD  [facility] Providers Cardiologist:  David Bergamo, MD        Persistent atrial fibrillation  TTE 09/09/2020: EF 55-60, mild MR, mild TR Hx of recurrent TIA Hypertension  Chronic kidney disease Stage III Hyperlipidemia  Bradycardia (asymptomatic x many years) First-degree AV block Central retinal vein occlusion Aortic atherosclerosis       Discussed the use of AI scribe software for clinical note transcription with the patient, who gave verbal consent to proceed. History of Present Illness David Stanton is a 89 y.o. male who returns for follow up of AFib. He was last seen 04/18/24. He had been dx with AFib at a visit with his PCP and placed on Eliquis . Echocardiogram was ordered as well as ABIs/arterial US  for evaluation of claudication. These studies are still pending. Of note, I previously reviewed his case with Dr. Bergamo regarding proceeding with DCCV if he remains in atrial fibrillation.   He began taking Eliquis  on September 8th and missed one dose last Friday night due to travel for a family funeral. He resumed his regular dosing schedule the following morning and has not missed any other doses. No dizziness, chest discomfort, or shortness of breath. He continues to participate in exercise classes. He has not been tested for sleep apnea but reports snoring as noted by his wife. He does not experience daytime fatigue.   ROS-See HPI    Studies Reviewed:  EKG Interpretation Date/Time:  Friday May 04 2024 10:48:09 EDT Ventricular Rate:  57 PR Interval:    QRS Duration:  82 QT Interval:  436 QTC Calculation: 424 R Axis:   -1  Text Interpretation: Atrial fibrillation with slow ventricular response Confirmed by David Stanton [phone]) on 05/04/2024 11:46:45 AM    Risk Assessment/Calculations: CHA2DS2-VASc Score = 6   This indicates a 9.7% annual risk of  stroke. The patient's score is based upon: CHF History: 0 HTN History: 1 Diabetes History: 0 Stroke History: 2 Vascular Disease History: 1 Age Score: 2 Gender Score: 0           Physical Exam:  VS:  BP 128/74 (BP Location: Left Arm, Patient Position: Sitting)   Pulse (!) 57   Ht 6' (1.829 m)   Wt 189 lb 11.2 oz (86 kg)   SpO2 98%   BMI 25.73 kg/m        Wt Readings from Last 3 Encounters:  05/04/24 189 lb 11.2 oz (86 kg)  04/18/24 191 lb 9.6 oz (86.9 kg)  12/13/23 192 lb 6.4 oz (87.3 kg)    Constitutional:      Appearance: Healthy appearance. Not in distress.  Neck:     Vascular: JVD normal.  Pulmonary:     Breath sounds: Normal breath sounds. No wheezing. No rales.  Cardiovascular:     Bradycardia present. Irregularly irregular rhythm.     Murmurs: There is no murmur.  Edema:    Peripheral edema absent.  Abdominal:     Palpations: Abdomen is soft.       Assessment and Plan:    Assessment & Plan Persistent atrial fibrillation (HCC) Pt recently noted to be in atrial fibrillation at annual visit with PCP. CHADS2-VASc=6. Pt will need long term anticoagulation.  He has been on anticoagulation for > 3 weeks now.  He remains in atrial fibrillation with slow rate.  He did miss  1 dose of Eliquis  1 week ago.  We discussed arranging cardioversion in a couple of weeks to make sure that he has been on uninterrupted anticoagulation for a total of 3 weeks.  Risks and benefits of proceeding with cardioversion were reviewed today.  The patient is willing to proceed. -BMET, CBC today -Continue Eliquis  2.5 mg twice daily (age, creatinine) -Arrange DCCV the week of October 13 -A-fib clinic follow-up 1 to 2 weeks after cardioversion -Follow-up with David Stanton 3 to 4 months Claudication ABIs, arterial dopplers are pending.  Stage 3a chronic kidney disease (HCC) Obtain follow-up BMET today.      Informed Consent   Shared Decision Making/Informed Consent The risks (stroke,  cardiac arrhythmias rarely resulting in the need for a temporary or permanent pacemaker, skin irritation or burns and complications associated with conscious sedation including aspiration, arrhythmia, respiratory failure and death), benefits (restoration of normal sinus rhythm) and alternatives of a direct current cardioversion were explained in detail to David Stanton and he agrees to proceed.       Dispo:  Return for Post Procedure Follow Up.  Signed, Glendia Ferrier, PA-C

## 2024-05-02 NOTE — Progress Notes (Unsigned)
 OFFICE NOTE:    Date:  05/04/2024  ID:  [name], DOB 1934-08-28, MRN [id number] PCP: Janey Santos, MD  [facility] Providers Cardiologist:  Gordy Bergamo, MD        Persistent atrial fibrillation  TTE 09/09/2020: EF 55-60, mild MR, mild TR Hx of recurrent TIA Hypertension  Chronic kidney disease Stage III Hyperlipidemia  Bradycardia (asymptomatic x many years) First-degree AV block Central retinal vein occlusion Aortic atherosclerosis       Discussed the use of AI scribe software for clinical note transcription with the patient, who gave verbal consent to proceed. History of Present Illness IZAYIAH TIBBITTS is a 89 y.o. male who returns for follow up of AFib. He was last seen 04/18/24. He had been dx with AFib at a visit with his PCP and placed on Eliquis . Echocardiogram was ordered as well as ABIs/arterial US  for evaluation of claudication. These studies are still pending. Of note, I previously reviewed his case with Dr. Bergamo regarding proceeding with DCCV if he remains in atrial fibrillation.   He began taking Eliquis  on September 8th and missed one dose last Friday night due to travel for a family funeral. He resumed his regular dosing schedule the following morning and has not missed any other doses. No dizziness, chest discomfort, or shortness of breath. He continues to participate in exercise classes. He has not been tested for sleep apnea but reports snoring as noted by his wife. He does not experience daytime fatigue.   ROS-See HPI    Studies Reviewed:  EKG Interpretation Date/Time:  Friday May 04 2024 10:48:09 EDT Ventricular Rate:  57 PR Interval:    QRS Duration:  82 QT Interval:  436 QTC Calculation: 424 R Axis:   -1  Text Interpretation: Atrial fibrillation with slow ventricular response Confirmed by Lelon Hamilton [phone]) on 05/04/2024 11:46:45 AM    Risk Assessment/Calculations: CHA2DS2-VASc Score = 6   This indicates a 9.7% annual risk of  stroke. The patient's score is based upon: CHF History: 0 HTN History: 1 Diabetes History: 0 Stroke History: 2 Vascular Disease History: 1 Age Score: 2 Gender Score: 0           Physical Exam:  VS:  BP 128/74 (BP Location: Left Arm, Patient Position: Sitting)   Pulse (!) 57   Ht 6' (1.829 m)   Wt 189 lb 11.2 oz (86 kg)   SpO2 98%   BMI 25.73 kg/m        Wt Readings from Last 3 Encounters:  05/04/24 189 lb 11.2 oz (86 kg)  04/18/24 191 lb 9.6 oz (86.9 kg)  12/13/23 192 lb 6.4 oz (87.3 kg)    Constitutional:      Appearance: Healthy appearance. Not in distress.  Neck:     Vascular: JVD normal.  Pulmonary:     Breath sounds: Normal breath sounds. No wheezing. No rales.  Cardiovascular:     Bradycardia present. Irregularly irregular rhythm.     Murmurs: There is no murmur.  Edema:    Peripheral edema absent.  Abdominal:     Palpations: Abdomen is soft.       Assessment and Plan:    Assessment & Plan Persistent atrial fibrillation (HCC) Pt recently noted to be in atrial fibrillation at annual visit with PCP. CHADS2-VASc=6. Pt will need long term anticoagulation.  He has been on anticoagulation for > 3 weeks now.  He remains in atrial fibrillation with slow rate.  He did miss  1 dose of Eliquis  1 week ago.  We discussed arranging cardioversion in a couple of weeks to make sure that he has been on uninterrupted anticoagulation for a total of 3 weeks.  Risks and benefits of proceeding with cardioversion were reviewed today.  The patient is willing to proceed. -BMET, CBC today -Continue Eliquis  2.5 mg twice daily (age, creatinine) -Arrange DCCV the week of October 13 -A-fib clinic follow-up 1 to 2 weeks after cardioversion -Follow-up with Dr. Ladona 3 to 4 months Claudication ABIs, arterial dopplers are pending.  Stage 3a chronic kidney disease (HCC) Obtain follow-up BMET today.      Informed Consent   Shared Decision Making/Informed Consent The risks (stroke,  cardiac arrhythmias rarely resulting in the need for a temporary or permanent pacemaker, skin irritation or burns and complications associated with conscious sedation including aspiration, arrhythmia, respiratory failure and death), benefits (restoration of normal sinus rhythm) and alternatives of a direct current cardioversion were explained in detail to Mr. Conry and he agrees to proceed.       Dispo:  Return for Post Procedure Follow Up.  Signed, Glendia Ferrier, PA-C

## 2024-05-02 NOTE — Assessment & Plan Note (Signed)
 Pt recently noted to be in atrial fibrillation at annual visit with PCP. CHADS2-VASc=6. Pt will need long term anticoagulation.  He has been on anticoagulation for > 3 weeks now.  He remains in atrial fibrillation with slow rate.  He did miss 1 dose of Eliquis  1 week ago.  We discussed arranging cardioversion in a couple of weeks to make sure that he has been on uninterrupted anticoagulation for a total of 3 weeks.  Risks and benefits of proceeding with cardioversion were reviewed today.  The patient is willing to proceed. -BMET, CBC today -Continue Eliquis  2.5 mg twice daily (age, creatinine) -Arrange DCCV the week of October 13 -A-fib clinic follow-up 1 to 2 weeks after cardioversion -Follow-up with Dr. Ladona 3 to 4 months

## 2024-05-04 ENCOUNTER — Encounter: Payer: Self-pay | Admitting: *Deleted

## 2024-05-04 ENCOUNTER — Ambulatory Visit: Attending: Physician Assistant | Admitting: Physician Assistant

## 2024-05-04 ENCOUNTER — Encounter: Payer: Self-pay | Admitting: Physician Assistant

## 2024-05-04 VITALS — BP 128/74 | HR 57 | Ht 72.0 in | Wt 189.7 lb

## 2024-05-04 DIAGNOSIS — I739 Peripheral vascular disease, unspecified: Secondary | ICD-10-CM | POA: Diagnosis not present

## 2024-05-04 DIAGNOSIS — E7800 Pure hypercholesterolemia, unspecified: Secondary | ICD-10-CM

## 2024-05-04 DIAGNOSIS — I4891 Unspecified atrial fibrillation: Secondary | ICD-10-CM | POA: Diagnosis not present

## 2024-05-04 DIAGNOSIS — I4819 Other persistent atrial fibrillation: Secondary | ICD-10-CM | POA: Diagnosis not present

## 2024-05-04 DIAGNOSIS — N1831 Chronic kidney disease, stage 3a: Secondary | ICD-10-CM | POA: Insufficient documentation

## 2024-05-04 NOTE — Patient Instructions (Signed)
 Medication Instructions:  Your physician recommends that you continue on your current medications as directed. Please refer to the Current Medication list given to you today.  *If you need a refill on your cardiac medications before your next appointment, please call your pharmacy*  Lab Work: BMET, CBC-TODAY If you have labs (blood work) drawn today and your tests are completely normal, you will receive your results only by: MyChart Message (if you have MyChart) OR A paper copy in the mail If you have any lab test that is abnormal or we need to change your treatment, we will call you to review the results.  Testing/Procedures: Your physician has recommended that you have a Cardioversion (DCCV). Electrical Cardioversion uses a jolt of electricity to your heart either through paddles or wired patches attached to your chest. This is a controlled, usually prescheduled, procedure. Defibrillation is done under light anesthesia in the hospital, and you usually go home the day of the procedure. This is done to get your heart back into a normal rhythm. You are not awake for the procedure. Please see the instruction sheet given to you today.   Follow-Up: At [facility], you and your health needs are our priority.  As part of our continuing mission to provide you with exceptional heart care, our providers are all part of one team.  This team includes your primary Cardiologist (physician) and Advanced Practice Providers or APPs (Physician Assistants and Nurse Practitioners) who all work together to provide you with the care you need, when you need it.  Your next appointment:   1-2 week(s) after cardioversion  Provider:   You will follow up in the Atrial Fibrillation Clinic located at [facility]. Your provider will be: Clint R. Fenton, PA-C or Fairy Heinrich, PA-C    Schedule 3-4 month follow up with Dr Ladona

## 2024-05-05 LAB — CBC
Hematocrit: 42 % (ref 37.5–51.0)
Hemoglobin: 13.5 g/dL (ref 13.0–17.7)
MCH: 29.8 pg (ref 26.6–33.0)
MCHC: 32.1 g/dL (ref 31.5–35.7)
MCV: 93 fL (ref 79–97)
Platelets: 200 x10E3/uL (ref 150–450)
RBC: 4.53 x10E6/uL (ref 4.14–5.80)
RDW: 12 % (ref 11.6–15.4)
WBC: 6.5 x10E3/uL (ref 3.4–10.8)

## 2024-05-05 LAB — BASIC METABOLIC PANEL WITH GFR
BUN/Creatinine Ratio: 13 (ref 10–24)
BUN: 23 mg/dL (ref 8–27)
CO2: 22 mmol/L (ref 20–29)
Calcium: 9.8 mg/dL (ref 8.6–10.2)
Chloride: 101 mmol/L (ref 96–106)
Creatinine, Ser: 1.78 mg/dL — ABNORMAL HIGH (ref 0.76–1.27)
Glucose: 98 mg/dL (ref 70–99)
Potassium: 4.5 mmol/L (ref 3.5–5.2)
Sodium: 139 mmol/L (ref 134–144)
eGFR: 36 mL/min/1.73 — ABNORMAL LOW (ref >59)

## 2024-05-06 ENCOUNTER — Ambulatory Visit: Payer: Self-pay | Admitting: Physician Assistant

## 2024-05-10 DIAGNOSIS — Z85828 Personal history of other malignant neoplasm of skin: Secondary | ICD-10-CM | POA: Diagnosis not present

## 2024-05-10 DIAGNOSIS — L570 Actinic keratosis: Secondary | ICD-10-CM | POA: Diagnosis not present

## 2024-05-10 DIAGNOSIS — L821 Other seborrheic keratosis: Secondary | ICD-10-CM | POA: Diagnosis not present

## 2024-05-10 DIAGNOSIS — L820 Inflamed seborrheic keratosis: Secondary | ICD-10-CM | POA: Diagnosis not present

## 2024-05-16 NOTE — Progress Notes (Signed)
 Spoke to patient and instructed them to come at 0645  and to be NPO after 0000.     Confirmed that patient will have a ride home and someone to stay with them for 24 hours after the procedure.   Medications reviewed.  Confirmed blood thinner.  Confirmed no breaks in taking blood thinner for 3+ weeks prior to procedure.

## 2024-05-17 ENCOUNTER — Ambulatory Visit (HOSPITAL_COMMUNITY)

## 2024-05-17 ENCOUNTER — Encounter (HOSPITAL_COMMUNITY): Payer: Self-pay | Admitting: Cardiovascular Disease

## 2024-05-17 ENCOUNTER — Ambulatory Visit (HOSPITAL_COMMUNITY)
Admission: RE | Admit: 2024-05-17 | Discharge: 2024-05-17 | Disposition: A | Discharge status: Home / Self Care | Attending: Cardiovascular Disease | Admitting: Cardiovascular Disease

## 2024-05-17 ENCOUNTER — Encounter (HOSPITAL_COMMUNITY)
Admission: RE | Disposition: A | Discharge status: Home / Self Care | Payer: Self-pay | Source: Home / Self Care | Attending: Cardiovascular Disease

## 2024-05-17 ENCOUNTER — Other Ambulatory Visit: Payer: Self-pay

## 2024-05-17 DIAGNOSIS — Z7901 Long term (current) use of anticoagulants: Secondary | ICD-10-CM | POA: Diagnosis not present

## 2024-05-17 DIAGNOSIS — I4891 Unspecified atrial fibrillation: Secondary | ICD-10-CM

## 2024-05-17 DIAGNOSIS — E1122 Type 2 diabetes mellitus with diabetic chronic kidney disease: Secondary | ICD-10-CM | POA: Diagnosis not present

## 2024-05-17 DIAGNOSIS — E1151 Type 2 diabetes mellitus with diabetic peripheral angiopathy without gangrene: Secondary | ICD-10-CM | POA: Diagnosis not present

## 2024-05-17 DIAGNOSIS — N1831 Chronic kidney disease, stage 3a: Secondary | ICD-10-CM | POA: Diagnosis not present

## 2024-05-17 DIAGNOSIS — I4819 Other persistent atrial fibrillation: Secondary | ICD-10-CM | POA: Insufficient documentation

## 2024-05-17 DIAGNOSIS — G459 Transient cerebral ischemic attack, unspecified: Secondary | ICD-10-CM

## 2024-05-17 DIAGNOSIS — E782 Mixed hyperlipidemia: Secondary | ICD-10-CM | POA: Diagnosis not present

## 2024-05-17 DIAGNOSIS — Z8673 Personal history of transient ischemic attack (TIA), and cerebral infarction without residual deficits: Secondary | ICD-10-CM | POA: Diagnosis not present

## 2024-05-17 DIAGNOSIS — I129 Hypertensive chronic kidney disease with stage 1 through stage 4 chronic kidney disease, or unspecified chronic kidney disease: Secondary | ICD-10-CM | POA: Diagnosis not present

## 2024-05-17 DIAGNOSIS — E785 Hyperlipidemia, unspecified: Secondary | ICD-10-CM | POA: Diagnosis not present

## 2024-05-17 HISTORY — PX: CARDIOVERSION: EP1203

## 2024-05-17 SURGERY — CARDIOVERSION (CATH LAB)
Anesthesia: General

## 2024-05-17 MED ORDER — PROPOFOL 10 MG/ML IV BOLUS
INTRAVENOUS | Status: DC | PRN
  Administered 2024-05-17: 50 mg via INTRAVENOUS

## 2024-05-17 MED ORDER — LIDOCAINE 2% (20 MG/ML) 5 ML SYRINGE
INTRAMUSCULAR | Status: DC | PRN
  Administered 2024-05-17: 100 mg via INTRAVENOUS

## 2024-05-17 SURGICAL SUPPLY — 1 items: PAD DEFIB RADIO PHYSIO CONN (PAD) ×2 IMPLANT

## 2024-05-17 NOTE — Transfer of Care (Signed)
 Immediate Anesthesia Transfer of Care Note  Patient: David Stanton  Procedure(s) Performed: CARDIOVERSION  Patient Location: PACU  Anesthesia Type:MAC  Level of Consciousness: drowsy  Airway & Oxygen Therapy: Patient Spontanous Breathing and Patient connected to nasal cannula oxygen  Post-op Assessment: Report given to RN and Post -op Vital signs reviewed and stable  Post vital signs: Reviewed and stable  Last Vitals:  Vitals Value Taken Time  BP    Temp    Pulse 54 05/17/24 07:53  Resp 11 05/17/24 07:53  SpO2 98 % 05/17/24 07:53  Vitals shown include unfiled device data.  Last Pain:  Vitals:   05/17/24 0720  TempSrc: Temporal         Complications: No notable events documented.

## 2024-05-17 NOTE — Discharge Instructions (Signed)

## 2024-05-17 NOTE — Interval H&P Note (Signed)
 History and Physical Interval Note:  05/17/2024 7:26 AM  [name]  has presented today for surgery, with the diagnosis of AFIB.  The various methods of treatment have been discussed with the patient and family. After consideration of risks, benefits and other options for treatment, the patient has consented to  Procedure(s): CARDIOVERSION (N/A) as a surgical intervention.  The patient's history has been reviewed, patient examined, no change in status, stable for surgery.  I have reviewed the patient's chart and labs.  Questions were answered to the patient's satisfaction.    NPO. On eliquis  ~3 weeks with no missed doses.   Signed, Darryle DASEN. Barbaraann, MD, [facility]  [facility]  [address] [place], [place] [address] [phone]  7:26 AM

## 2024-05-17 NOTE — CV Procedure (Signed)
   DIRECT CURRENT CARDIOVERSION  NAME:  David Stanton    MRN: [id number] DOB:  [birth year]    ADMIT DATE: 05/17/2024  Indication:  Symptomatic atrial fibrillation   Procedure Note:  The patient signed informed consent.  They have had had therapeutic anticoagulation with eliquis  greater than 3 weeks.  Anesthesia was administered by Dr. Erma.  Adequate airway was maintained throughout and vital followed per protocol.  They were cardioverted x 2 with 200J of biphasic synchronized energy.  They converted to NSR.  There were no apparent complications.  The patient had normal neuro status and respiratory status post procedure with vitals stable as recorded elsewhere.    Follow up: They will continue on current medical therapy and follow up with cardiology as scheduled.  [name], MD, [facility]  [facility]  [address] [place], [place] [address] [phone]  8:01 AM

## 2024-05-17 NOTE — Anesthesia Preprocedure Evaluation (Signed)
 Anesthesia Evaluation  Patient identified by MRN, date of birth, ID band Patient awake    Reviewed: Allergy & Precautions, H&P , NPO status , Patient's Chart, lab work & pertinent test results  History of Anesthesia Complications Negative for: history of anesthetic complications  Airway Mallampati: II  TM Distance: >3 FB Neck ROM: Full    Dental no notable dental hx.    Pulmonary neg pulmonary ROS, neg sleep apnea, former smoker   Pulmonary exam normal breath sounds clear to auscultation       Cardiovascular (-) hypertension(-) angina (-) Past MI Normal cardiovascular exam+ dysrhythmias Atrial Fibrillation  Rhythm:Regular Rate:Normal     Neuro/Psych neg Seizures negative neurological ROS  negative psych ROS   GI/Hepatic Neg liver ROS,GERD  ,,  Endo/Other  negative endocrine ROS    Renal/GU CRFRenal disease  negative genitourinary   Musculoskeletal  (+) Arthritis ,    Abdominal   Peds negative pediatric ROS (+)  Hematology negative hematology ROS (+)   Anesthesia Other Findings   Reproductive/Obstetrics negative OB ROS                              Anesthesia Physical Anesthesia Plan  ASA: 3  Anesthesia Plan: General   Post-op Pain Management:    Induction: Intravenous  PONV Risk Score and Plan: 2 and Treatment may vary due to age or medical condition  Airway Management Planned: Natural Airway and Simple Face Mask  Additional Equipment: None  Intra-op Plan:   Post-operative Plan:   Informed Consent: I have reviewed the patients History and Physical, chart, labs and discussed the procedure including the risks, benefits and alternatives for the proposed anesthesia with the patient or authorized representative who has indicated his/her understanding and acceptance.     Dental advisory given  Plan Discussed with: CRNA  Anesthesia Plan Comments:          Anesthesia  Quick Evaluation

## 2024-05-17 NOTE — Anesthesia Postprocedure Evaluation (Signed)
 Anesthesia Post Note  Patient: David Stanton  Procedure(s) Performed: CARDIOVERSION     Patient location during evaluation: PACU Anesthesia Type: General Level of consciousness: awake and alert Pain management: pain level controlled Vital Signs Assessment: post-procedure vital signs reviewed and stable Respiratory status: spontaneous breathing, nonlabored ventilation, respiratory function stable and patient connected to nasal cannula oxygen Cardiovascular status: blood pressure returned to baseline and stable Postop Assessment: no apparent nausea or vomiting Anesthetic complications: no   No notable events documented.  Last Vitals:  Vitals:   05/17/24 0814 05/17/24 0824  BP: 114/79 121/69  Pulse: 72 65  Resp: 15 17  Temp:    SpO2: 97% 95%    Last Pain:  Vitals:   05/17/24 0824  TempSrc:   PainSc: 0-No pain                 [name]

## 2024-05-22 NOTE — Research (Signed)
 Masimo DCCV Informed Consent   Subject Name: David Stanton  Subject met inclusion and exclusion criteria.  The informed consent form, study requirements and expectations were reviewed with the subject and questions and concerns were addressed prior to the signing of the consent form.  The subject verbalized understanding of the trial requirements.  The subject agreed to participate in the Masimo trial and signed the informed consent at 0710 on Masimo DCCV.  The informed consent was obtained prior to performance of any protocol-specific procedures for the subject.  A copy of the signed informed consent was given to the subject and a copy was placed in the subject's medical record.   [name]

## 2024-05-23 ENCOUNTER — Encounter (HOSPITAL_COMMUNITY)

## 2024-05-23 ENCOUNTER — Other Ambulatory Visit (HOSPITAL_COMMUNITY)

## 2024-05-31 ENCOUNTER — Ambulatory Visit (HOSPITAL_COMMUNITY)
Admission: RE | Admit: 2024-05-31 | Discharge: 2024-05-31 | Disposition: A | Discharge status: Home / Self Care | Source: Ambulatory Visit | Attending: Physician Assistant | Admitting: Physician Assistant

## 2024-05-31 ENCOUNTER — Ambulatory Visit (INDEPENDENT_AMBULATORY_CARE_PROVIDER_SITE_OTHER)
Admission: RE | Admit: 2024-05-31 | Discharge: 2024-05-31 | Disposition: A | Discharge status: Home / Self Care | Source: Ambulatory Visit | Attending: Internal Medicine | Admitting: Internal Medicine

## 2024-05-31 ENCOUNTER — Ambulatory Visit (HOSPITAL_BASED_OUTPATIENT_CLINIC_OR_DEPARTMENT_OTHER)
Admission: RE | Admit: 2024-05-31 | Discharge: 2024-05-31 | Disposition: A | Discharge status: Home / Self Care | Source: Ambulatory Visit | Attending: Physician Assistant | Admitting: Physician Assistant

## 2024-05-31 ENCOUNTER — Encounter (HOSPITAL_COMMUNITY): Payer: Self-pay | Admitting: Internal Medicine

## 2024-05-31 VITALS — BP 122/90 | HR 53 | Ht 72.0 in | Wt 190.0 lb

## 2024-05-31 DIAGNOSIS — I4819 Other persistent atrial fibrillation: Secondary | ICD-10-CM | POA: Insufficient documentation

## 2024-05-31 DIAGNOSIS — D6869 Other thrombophilia: Secondary | ICD-10-CM | POA: Insufficient documentation

## 2024-05-31 DIAGNOSIS — I739 Peripheral vascular disease, unspecified: Secondary | ICD-10-CM | POA: Insufficient documentation

## 2024-05-31 DIAGNOSIS — I4891 Unspecified atrial fibrillation: Secondary | ICD-10-CM | POA: Insufficient documentation

## 2024-05-31 LAB — ECHOCARDIOGRAM COMPLETE
AR max vel: 2.66 cm2
AV Area VTI: 2.61 cm2
AV Area mean vel: 2.45 cm2
AV Mean grad: 2 mmHg
AV Peak grad: 3.7 mmHg
Ao pk vel: 0.96 m/s
Height: 72 in
S' Lateral: 2.16 cm
Weight: 3040 [oz_av]

## 2024-05-31 NOTE — Patient Instructions (Signed)
 David Stanton has had early return of abnormal heart rhythm following a successful cardioversion. This would imply the patient needs a medication to help try to stay in normal rhythm. A medication often used is an anti-arrhythmic medication called amiodarone. It has the potential to affect the liver, lungs, or thyroid  if taken for several years. [name] would likely have to take this medication daily indefinitely in order to try to stay in normal rhythm. He would take one pill daily if he chose to start medication and we would see him again in 3-4 weeks to determine if he needs another cardioversion. The difference in doing a second cardioversion is he's now taking a medication to help hold on to normal rhythm after the procedure. This medication is an effective treatment but not a cure. He may have breakthrough Afib in the future; we cannot predict this. He also has the choice of living in Afib and not doing further treatment. With either choice, we recommend he stay on Eliquis  taking it twice daily (every 12 hours).

## 2024-05-31 NOTE — Progress Notes (Signed)
 Primary Care Physician: Janey Santos, MD Primary Cardiologist: Gordy Bergamo, MD Electrophysiologist: None     Referring Physician: Lelon Hamilton, PA-C     David Stanton is a 89 y.o. male with a history of recurrent TIA, HTN, CKD, HLD, first degree AV block, central retinal vein occlusion, aortic atherosclerosis, and persistent atrial fibrillation who presents for consultation in the [facility]. S/p DCCV on 05/17/24. Patient is on Eliquis  for stroke prevention.  On evaluation today, patient is currently in atrial flutter. He notes that last night his Kardiamobile device showed he was in possible Afib. No missed doses of Eliquis .   Today, he denies symptoms of palpitations, chest pain, shortness of breath, orthopnea, PND, lower extremity edema, dizziness, presyncope, syncope, bleeding, or neurologic sequela. The patient is tolerating medications without difficulties and is otherwise without complaint today.    he has a BMI of Body mass index is 25.77 kg/m.SABRA Filed Weights   05/31/24 1029  Weight: 86.2 kg    Current Outpatient Medications  Medication Sig Dispense Refill   acetaminophen  (TYLENOL ) 500 MG tablet Take 500 mg by mouth every 6 (six) hours as needed (Arthritis).     apixaban  (ELIQUIS ) 2.5 MG TABS tablet Take 1 tablet (2.5 mg total) by mouth 2 (two) times daily. 180 tablet 3   cetirizine (ZYRTEC) 10 MG tablet Take 10 mg by mouth daily as needed for allergies.     Cholecalciferol (VITAMIN D3) 50 MCG (2000 UT) CAPS Take 2,000 Units by mouth 2 (two) times daily.     dutasteride (AVODART) 0.5 MG capsule Take 0.5 mg by mouth daily.     fenofibrate (TRICOR) 48 MG tablet Take 48 mg by mouth daily.     Multiple Vitamins-Minerals (PRESERVISION AREDS 2) CAPS Take 1 capsule by mouth 2 (two) times daily.     pantoprazole (PROTONIX) 40 MG tablet Take 1 tablet by mouth every 12 (twelve) hours. (Patient taking differently: Take 40 mg by mouth daily.)      pravastatin (PRAVACHOL) 40 MG tablet Take 40 mg by mouth at bedtime.     No current facility-administered medications for this encounter.    Atrial Fibrillation Management history:  Previous antiarrhythmic drugs: none Previous cardioversions: 05/17/24 Previous ablations: none Anticoagulation history: Eliquis    ROS- All systems are reviewed and negative except as per the HPI above.  Physical Exam: BP (!) 122/90   Pulse (!) 53   Ht 6' (1.829 m)   Wt 86.2 kg   BMI 25.77 kg/m   GEN: Well nourished, well developed in no acute distress NECK: No JVD; No carotid bruits CARDIAC: Irregularly irregular rate and rhythm, no murmurs, rubs, gallops RESPIRATORY:  Clear to auscultation without rales, wheezing or rhonchi  ABDOMEN: Soft, non-tender, non-distended EXTREMITIES:  No edema; No deformity   EKG today demonstrates  Vent. rate 53 BPM PR interval * ms QRS duration 84 ms QT/QTcB 446/418 ms P-R-T axes * 69 84 Atrial flutter with variable A-V block Abnormal ECG When compared with ECG of 17-May-2024 08:06, Atrial flutter has replaced Sinus rhythm  Echo 09/09/20 demonstrated  Normal LV systolic function with visual EF 55-60%. Left ventricle cavity  is normal in size. Normal global wall motion. Normal diastolic filling  pattern, normal LAP.  Mild (Grade I) mitral regurgitation.  Mild tricuspid regurgitation. No evidence of pulmonary hypertension.  Compared to prior study dated 03/22/2013: no significant change except  Trace MR and TR are now Mild in severity.   ASSESSMENT & PLAN  CHA2DS2-VASc Score = 6  The patient's score is based upon: CHF History: 0 HTN History: 1 Diabetes History: 0 Stroke History: 2 Vascular Disease History: 1 Age Score: 2 Gender Score: 0       ASSESSMENT AND PLAN: Persistent Atrial Fibrillation / flutter (ICD10:  I48.19) The patient's CHA2DS2-VASc score is 6, indicating a 9.7% annual risk of stroke.   S/p DCCV on 05/17/24.  Patient is currently in  atrial flutter. We discussed rhythm control options and rate control strategy. Patient notes to me that he was surprised to find out he was in Afib on annual visit with PCP. We discussed medication treatment such as amiodarone and repeat cardioversion. We also discussed rate control strategy in which patient would just continue current medication regimen without change (he has baseline bradycardia while out of rhythm). Patient is going to discuss with family whether he wishes to pursue AAD + repeat cardioversion or remain in rate controlled Afib.  Secondary Hypercoagulable State (ICD10:  D68.69) The patient is at significant risk for stroke/thromboembolism based upon his CHA2DS2-VASc Score of 6.  Continue Apixaban  (Eliquis ).  Continue Eliquis  without interruption.    Follow up Afib clinic prn.   Terra Pac, [facility]  Afib Clinic [address] [place], [place] [address] [phone]

## 2024-06-01 ENCOUNTER — Ambulatory Visit: Payer: Self-pay | Admitting: Physician Assistant

## 2024-06-01 DIAGNOSIS — I4819 Other persistent atrial fibrillation: Secondary | ICD-10-CM

## 2024-06-01 LAB — VAS US ABI WITH/WO TBI
Left ABI: 1.18
Right ABI: 1.24

## 2024-06-04 ENCOUNTER — Encounter: Payer: Self-pay | Admitting: Physician Assistant

## 2024-06-08 NOTE — Telephone Encounter (Addendum)
 LVM asking pt to call our office to discuss test results. 1st attempt

## 2024-06-12 NOTE — Telephone Encounter (Signed)
 Pt called to FU please advise

## 2024-06-19 ENCOUNTER — Encounter: Payer: Self-pay | Admitting: Podiatry

## 2024-06-19 ENCOUNTER — Ambulatory Visit (INDEPENDENT_AMBULATORY_CARE_PROVIDER_SITE_OTHER): Admitting: Podiatry

## 2024-06-19 DIAGNOSIS — B351 Tinea unguium: Secondary | ICD-10-CM | POA: Diagnosis not present

## 2024-06-19 DIAGNOSIS — L84 Corns and callosities: Secondary | ICD-10-CM | POA: Diagnosis not present

## 2024-06-19 DIAGNOSIS — M79675 Pain in left toe(s): Secondary | ICD-10-CM

## 2024-06-19 DIAGNOSIS — I739 Peripheral vascular disease, unspecified: Secondary | ICD-10-CM | POA: Diagnosis not present

## 2024-06-19 DIAGNOSIS — M79674 Pain in right toe(s): Secondary | ICD-10-CM | POA: Diagnosis not present

## 2024-06-19 NOTE — Progress Notes (Signed)
    Subjective:  Patient ID: David Stanton, male    DOB: [birth year],  MRN: [id number]  David Stanton presents to clinic today for:  Chief Complaint  Patient presents with   RFC     RFC and Callus care. Non diabetic. 5 pain walking. Wearing inserts in shoes.   Patient notes nails are thick and elongated, causing pain in shoe gear when ambulating.  He has painful callus left submet 5.  He has a less noticeable Callus right submet 5  PCP is Avva, Ravisankar, MD. last seen around 10/11/2023  Past Medical History:  Diagnosis Date   High cholesterol    Leg pain    ABIs 06/01/24: R 1.24; L 1.18 (normal)   Retinal hemorrhage    Allergies  Allergen Reactions   Omeprazole Other (See Comments)    The addivities that are in the medicine   Tamsulosin Other (See Comments)    Vertigo     Objective:  David Stanton is a pleasant 89 y.o. male in NAD. AAO x 3.  Vascular Examination: Patient has palpable DP pulse, absent PT pulse bilateral.  Delayed capillary refill bilateral toes.  Sparse digital hair bilateral.  Proximal to distal cooling WNL bilateral.    Dermatological Examination: Interspaces are clear with no open lesions noted bilateral.  Skin is shiny and atrophic bilateral.  Nails are 3-46mm thick, with yellowish/brown discoloration, subungual debris and distal onycholysis x10.  There is pain with compression of nails x10.  There are hyperkeratotic lesions noted bilateral submet 5.  Patient qualifies for at-risk foot care because of PVD.  Assessment/Plan: 1. Pain due to onychomycosis of toenails of both feet   2. Callus of foot   3. PVD (peripheral vascular disease)     Mycotic nails x10 were sharply debrided with sterile nail nippers and power debriding burr to decrease bulk and length.  Hyperkeratotic lesions x 2 were shaved with #312 blade.  Return in about 3 months (around 09/19/2024) for RFC.   Awanda CHARM Imperial, DPM, FACFAS [facility]     [address] [place], [place] [address]                Office [phone]  Fax [phone]

## 2024-07-02 ENCOUNTER — Encounter: Payer: Self-pay | Admitting: Cardiology

## 2024-07-02 ENCOUNTER — Ambulatory Visit: Attending: Cardiology | Admitting: Cardiology

## 2024-07-02 VITALS — BP 130/68 | HR 63 | Resp 16 | Ht 72.0 in | Wt 188.4 lb

## 2024-07-02 DIAGNOSIS — Z8673 Personal history of transient ischemic attack (TIA), and cerebral infarction without residual deficits: Secondary | ICD-10-CM | POA: Insufficient documentation

## 2024-07-02 DIAGNOSIS — I4821 Permanent atrial fibrillation: Secondary | ICD-10-CM | POA: Diagnosis not present

## 2024-07-02 DIAGNOSIS — R001 Bradycardia, unspecified: Secondary | ICD-10-CM | POA: Insufficient documentation

## 2024-07-02 NOTE — Patient Instructions (Signed)
 Medication Instructions:  Your physician recommends that you continue on your current medications as directed. Please refer to the Current Medication list given to you today.  *If you need a refill on your cardiac medications before your next appointment, please call your pharmacy*  Lab Work: NONE If you have labs (blood work) drawn today and your tests are completely normal, you will receive your results only by: MyChart Message (if you have MyChart) OR A paper copy in the mail If you have any lab test that is abnormal or we need to change your treatment, we will call you to review the results.  Testing/Procedures: NONE  Follow-Up: At [facility], you and your health needs are our priority.  As part of our continuing mission to provide you with exceptional heart care, our providers are all part of one team.  This team includes your primary Cardiologist (physician) and Advanced Practice Providers or APPs (Physician Assistants and Nurse Practitioners) who all work together to provide you with the care you need, when you need it.  Your next appointment:   As needed  Provider:   [place], MD  We recommend signing up for the patient portal called MyChart.  Sign up information is provided on this After Visit Summary.  MyChart is used to connect with patients for Virtual Visits (Telemedicine).  Patients are able to view lab/test results, encounter notes, upcoming appointments, etc.  Non-urgent messages can be sent to your provider as well.   To learn more about what you can do with MyChart, go to [email or web address].

## 2024-07-02 NOTE — Progress Notes (Signed)
 Cardiology Office Note:  .   Date:  07/02/2024  ID:  David Stanton, DOB 17-Jan-1935, MRN [id number] PCP: Janey Santos, MD  [facility] Providers Cardiologist:  Gordy Bergamo, MD   History of Present Illness: .   David Stanton is a 89 y.o. 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 He also has asymptomatic profound bradycardia for many years with HR 35 to 40/min.    He was diagnosed with new onset atrial fibrillation by PCP and started on Eliquis  on 04/04/2024 during annual visit.  He was asymptomatic.  He underwent direct-current cardioversion on 05/17/2024 but upon presentation back for follow-up on 05/31/2024, he was in atypical atrial flutter.  He now presents to discuss further repeat cardioversion would be appropriate.    Discussed the use of AI scribe software for clinical note transcription with the patient, who gave verbal consent to proceed.  History of Present Illness David Stanton is an 89 year old male with atrial fibrillation who presents for follow-up after cardioversion. He is accompanied by his daughter, [name]. He was referred by his primary care physician after an EKG showed atrial fibrillation.  He has atrial fibrillation and prior strokes. He was asymptomatic at diagnosis and started on Eliquis  (apixaban ).  He underwent successful cardioversion to sinus rhythm but reverted to atrial fibrillation shortly after. He has no palpitations, dizziness, syncope, or other AFib-related symptoms.  He uses a personal device that consistently flags possible AFib. He is active and able to attend church, work in his yard, and participate in honor guard activities without limitation.  He takes Eliquis  2.5 mg twice daily. He has not used amiodarone and has  had no repeat cardioversion attempts since the initial procedure.  Cardiac Studies relevent.    ECHOCARDIOGRAM COMPLETE 05/31/2024  1. Left ventricular ejection fraction, by estimation, is 50 to 55%. The left ventricle has low normal function. The left ventricle demonstrates regional wall motion abnormalities (see scoring diagram/findings for description). There is mild left ventricular hypertrophy. Left ventricular diastolic function could not be evaluated. The average left ventricular global longitudinal strain is -15.0 %. The global longitudinal strain is abnormal. 2. Right ventricular systolic function is mildly reduced. The right ventricular size is moderately enlarged. There is normal pulmonary artery systolic pressure. 3. Left atrial size was mild to moderately dilated. 4. Right atrial size was moderately dilated.  Labs   ROS  Review of Systems  Cardiovascular:  Negative for chest pain, dyspnea on exertion and leg swelling.   Physical Exam:   VS:  BP 130/68 (BP Location: Left Arm, Patient Position: Sitting, Cuff Size: Normal)   Pulse 63   Resp 16   Ht 6' (1.829 m)   Wt 188 lb 6.4 oz (85.5 kg)   SpO2 96%   BMI 25.55 kg/m    Wt Readings from Last 3 Encounters:  07/02/24 188 lb 6.4 oz (85.5 kg)  05/31/24 190 lb (86.2 kg)  05/17/24 184 lb (83.5 kg)    BP Readings from Last 3 Encounters:  07/02/24 130/68  05/31/24 (!) 122/90  05/17/24 121/69   Physical Exam Neck:     Vascular: No carotid bruit or JVD.  Cardiovascular:     Rate and Rhythm: Normal rate. Rhythm irregular.     Pulses: Normal pulses and intact distal pulses.  Heart sounds: No murmur heard. Pulmonary:     Effort: Pulmonary effort is normal.     Breath sounds: Normal breath sounds.  Abdominal:     General: Bowel sounds are normal.     Palpations: Abdomen is soft.  Musculoskeletal:     Right lower leg: No edema.     Left lower leg: No edema.  Skin:    Capillary Refill: Capillary refill takes less  than 2 seconds.    EKG:       EKG 05/31/2024: Coarse atrial fibrillation with heart rate of 53 bpm.  Nonspecific T abnormality.  Compared to 05/17/2024, sinus rhythm with first-degree AV block with frequent PACs has been replaced.  No change compared to 05/04/2024.  ASSESSMENT AND PLAN: .      ICD-10-CM   1. Permanent atrial fibrillation (HCC)  I48.21     2. Bradycardia by electrocardiography  R00.1     3. History of cerebrovascular accident  Z86.73      Assessment & Plan Permanent atrial fibrillation Asymptomatic with no heart failure, fatigue, shortness of breath, or leg swelling. High stroke risk due to age and history of cerebrovascular accident. Cardioversion and ablation not indicated due to lack of symptoms and low success rate in persistent AFib. Amiodarone not recommended due to potential need for pacemaker. - Continue apixaban  2.5 mg twice daily. - Monitor for signs of bleeding, such as black stools. - Canceled appointments with AFib clinic. - As he remains asymptomatic and heart rate is well-controlled, no further cardiac follow-up is indicated.  History of cerebrovascular accident Contributes to high stroke risk, increasing CHADS2 score to 4. Continued anticoagulation with apixaban  is essential to prevent further strokes. - Continue apixaban  2.5 mg twice daily.  Follow up: PRN  Signed,  Gordy Bergamo, MD, [facility] 07/02/2024, 6:28 PM [facility] [address] [place], [place] [address] Phone: [phone]. Fax:  [phone]

## 2024-09-17 ENCOUNTER — Ambulatory Visit: Admitting: Podiatry

## 2024-09-24 ENCOUNTER — Ambulatory Visit: Admitting: Podiatry
